# Patient Record
Sex: Male | Born: 1990 | Race: White | Hispanic: No | Marital: Single | Smoking: Never smoker
Health system: Southern US, Community
[De-identification: ages and names within clinical notes are randomized; demographics above are authoritative.]

## PROBLEM LIST (undated history)

## (undated) DIAGNOSIS — A069 Amebiasis, unspecified: Secondary | ICD-10-CM

## (undated) DIAGNOSIS — A0472 Enterocolitis due to Clostridium difficile, not specified as recurrent: Secondary | ICD-10-CM

## (undated) DIAGNOSIS — K311 Adult hypertrophic pyloric stenosis: Secondary | ICD-10-CM

## (undated) DIAGNOSIS — F329 Major depressive disorder, single episode, unspecified: Secondary | ICD-10-CM

## (undated) DIAGNOSIS — F32A Depression, unspecified: Secondary | ICD-10-CM

## (undated) DIAGNOSIS — K219 Gastro-esophageal reflux disease without esophagitis: Secondary | ICD-10-CM

## (undated) HISTORY — PX: PYLOROPLASTY: SHX418

## (undated) HISTORY — DX: Amebiasis, unspecified: A06.9

## (undated) HISTORY — DX: Depression, unspecified: F32.A

## (undated) HISTORY — DX: Major depressive disorder, single episode, unspecified: F32.9

---
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
## 2014-08-18 HISTORY — PX: OTHER SURGICAL HISTORY: SHX169

## 2014-12-27 ENCOUNTER — Encounter: Payer: Self-pay | Admitting: Gastroenterology

## 2015-01-02 ENCOUNTER — Other Ambulatory Visit: Payer: Self-pay | Admitting: Gastroenterology

## 2015-01-02 DIAGNOSIS — R1084 Generalized abdominal pain: Secondary | ICD-10-CM

## 2015-01-02 DIAGNOSIS — R634 Abnormal weight loss: Secondary | ICD-10-CM

## 2015-01-04 ENCOUNTER — Ambulatory Visit
Admission: RE | Admit: 2015-01-04 | Discharge: 2015-01-04 | Disposition: A | Discharge status: Home / Self Care | Payer: BLUE CROSS/BLUE SHIELD | Source: Ambulatory Visit | Attending: Gastroenterology | Admitting: Gastroenterology

## 2015-01-04 DIAGNOSIS — R1084 Generalized abdominal pain: Secondary | ICD-10-CM

## 2015-01-04 DIAGNOSIS — R634 Abnormal weight loss: Secondary | ICD-10-CM

## 2015-02-23 ENCOUNTER — Ambulatory Visit: Payer: Self-pay | Admitting: Gastroenterology

## 2015-03-04 ENCOUNTER — Emergency Department (HOSPITAL_COMMUNITY)
Admission: EM | Admit: 2015-03-04 | Discharge: 2015-03-04 | Disposition: A | Discharge status: Home / Self Care | Payer: BLUE CROSS/BLUE SHIELD | Attending: Emergency Medicine | Admitting: Emergency Medicine

## 2015-03-04 ENCOUNTER — Emergency Department (HOSPITAL_COMMUNITY): Payer: BLUE CROSS/BLUE SHIELD

## 2015-03-04 ENCOUNTER — Encounter (HOSPITAL_COMMUNITY): Payer: Self-pay

## 2015-03-04 DIAGNOSIS — R109 Unspecified abdominal pain: Secondary | ICD-10-CM | POA: Insufficient documentation

## 2015-03-04 DIAGNOSIS — Z8619 Personal history of other infectious and parasitic diseases: Secondary | ICD-10-CM | POA: Insufficient documentation

## 2015-03-04 DIAGNOSIS — Z720 Tobacco use: Secondary | ICD-10-CM | POA: Insufficient documentation

## 2015-03-04 DIAGNOSIS — R1110 Vomiting, unspecified: Secondary | ICD-10-CM

## 2015-03-04 HISTORY — DX: Enterocolitis due to Clostridium difficile, not specified as recurrent: A04.72

## 2015-03-04 LAB — COMPREHENSIVE METABOLIC PANEL
ALK PHOS: 69 U/L (ref 38–126)
ALT: 27 U/L (ref 17–63)
AST: 36 U/L (ref 15–41)
Albumin: 4.8 g/dL (ref 3.5–5.0)
Anion gap: 16 — ABNORMAL HIGH (ref 5–15)
BUN: 14 mg/dL (ref 6–20)
CHLORIDE: 104 mmol/L (ref 101–111)
CO2: 21 mmol/L — AB (ref 22–32)
Calcium: 9.9 mg/dL (ref 8.9–10.3)
Creatinine, Ser: 1.05 mg/dL (ref 0.61–1.24)
Glucose, Bld: 72 mg/dL (ref 65–99)
Potassium: 4.2 mmol/L (ref 3.5–5.1)
Sodium: 141 mmol/L (ref 135–145)
Total Bilirubin: 1.5 mg/dL — ABNORMAL HIGH (ref 0.3–1.2)
Total Protein: 7.7 g/dL (ref 6.5–8.1)

## 2015-03-04 LAB — CBC
HEMATOCRIT: 46.4 % (ref 39.0–52.0)
Hemoglobin: 16.5 g/dL (ref 13.0–17.0)
MCH: 30.9 pg (ref 26.0–34.0)
MCHC: 35.6 g/dL (ref 30.0–36.0)
MCV: 86.9 fL (ref 78.0–100.0)
Platelets: 169 10*3/uL (ref 150–400)
RBC: 5.34 MIL/uL (ref 4.22–5.81)
RDW: 12.9 % (ref 11.5–15.5)
WBC: 8.4 10*3/uL (ref 4.0–10.5)

## 2015-03-04 LAB — URINALYSIS, ROUTINE W REFLEX MICROSCOPIC
Bilirubin Urine: NEGATIVE
GLUCOSE, UA: NEGATIVE mg/dL
Hgb urine dipstick: NEGATIVE
LEUKOCYTES UA: NEGATIVE
NITRITE: NEGATIVE
PROTEIN: NEGATIVE mg/dL
Specific Gravity, Urine: 1.025 (ref 1.005–1.030)
UROBILINOGEN UA: 0.2 mg/dL (ref 0.0–1.0)
pH: 5 (ref 5.0–8.0)

## 2015-03-04 LAB — LIPASE, BLOOD: Lipase: 29 U/L (ref 22–51)

## 2015-03-04 MED ORDER — ONDANSETRON 4 MG PO TBDP
ORAL_TABLET | ORAL | Status: AC
  Filled 2015-03-04: qty 1

## 2015-03-04 MED ORDER — PANTOPRAZOLE SODIUM 20 MG PO TBEC: 20.0000 mg | DELAYED_RELEASE_TABLET | Freq: Two times a day (BID) | ORAL | Status: DC

## 2015-03-04 MED ORDER — SODIUM CHLORIDE 0.9 % IV SOLN
1000.0000 mL | Freq: Once | INTRAVENOUS | Status: AC
  Administered 2015-03-04: 1000 mL via INTRAVENOUS

## 2015-03-04 MED ORDER — PROMETHAZINE HCL 25 MG/ML IJ SOLN
12.5000 mg | Freq: Once | INTRAMUSCULAR | Status: DC
  Filled 2015-03-04: qty 1

## 2015-03-04 MED ORDER — ONDANSETRON HCL 4 MG PO TABS: 4.0000 mg | ORAL_TABLET | Freq: Four times a day (QID) | ORAL | Status: AC

## 2015-03-04 MED ORDER — ONDANSETRON 4 MG PO TBDP
4.0000 mg | ORAL_TABLET | Freq: Once | ORAL | Status: AC | PRN
  Administered 2015-03-04: 4 mg via ORAL

## 2015-03-04 MED ORDER — SODIUM CHLORIDE 0.9 % IV BOLUS (SEPSIS)
1000.0000 mL | Freq: Once | INTRAVENOUS | Status: AC
  Administered 2015-03-04: 1000 mL via INTRAVENOUS

## 2015-03-04 MED ORDER — PANTOPRAZOLE SODIUM 40 MG IV SOLR
40.0000 mg | Freq: Once | INTRAVENOUS | Status: AC
  Administered 2015-03-04: 40 mg via INTRAVENOUS
  Filled 2015-03-04: qty 40

## 2015-03-04 MED ORDER — PROMETHAZINE HCL 25 MG/ML IJ SOLN
12.5000 mg | Freq: Once | INTRAMUSCULAR | Status: AC
  Administered 2015-03-04: 12.5 mg via INTRAVENOUS
  Filled 2015-03-04: qty 1

## 2015-03-04 MED ORDER — VANCOMYCIN HCL IN DEXTROSE 1-5 GM/200ML-% IV SOLN
1000.0000 mg | Freq: Once | INTRAVENOUS | Status: AC
  Administered 2015-03-04: 1000 mg via INTRAVENOUS
  Filled 2015-03-04: qty 200

## 2015-03-04 NOTE — ED Notes (Signed)
Pt ambulated to the bathroom with ease 

## 2015-03-04 NOTE — ED Notes (Signed)
Pt drank beers last night, did not think he drunk more than usual.  Onset this morning vomiting x 30, small amounts, abd discomfort.  Pt is being treated for Cdiff- has been on antibiotics x 1 week.  Pyloric stenosis surgery 6 weeks ago.

## 2015-03-04 NOTE — Discharge Instructions (Signed)

## 2015-03-04 NOTE — ED Provider Notes (Signed)
CSN: [id number]     Arrival date & time 03/04/15  1227 History   First MD Initiated Contact with Patient 03/04/15 1343     Chief Complaint  Patient presents with  . Emesis      HPI Pt drank beers last night, did not think he drunk more than usual. Onset this morning vomiting x 30, small amounts, abd discomfort. Pt is being treated for Cdiff- has been on antibiotics x 1 week. Pyloric stenosis surgery 6 weeks ago.  Past Medical History  Diagnosis Date  . C. difficile diarrhea    Past Surgical History  Procedure Laterality Date  . Pyloric stenosis sx   2016   History reviewed. No pertinent family history. History  Substance Use Topics  . Smoking status: Current Every Day Smoker  . Smokeless tobacco: Not on file  . Alcohol Use: Yes    Review of Systems  All other systems reviewed and are negative  Allergies  Review of patient's allergies indicates no known allergies.  Home Medications   Prior to Admission medications   Not on File   BP 127/78 mmHg  Pulse 57  Temp(Src) 97.4 F (36.3 C) (Axillary)  Resp 18  Ht 5\' 9"  (1.753 m)  Wt 130 lb (58.968 kg)  BMI 19.19 kg/m2  SpO2 97% Physical Exam Physical Exam  Nursing note and vitals reviewed. Constitutional: He is oriented to person, place, and time. He appears well-developed and well-nourished. No distress.  HENT:  Head: Normocephalic and atraumatic.  Eyes: Pupils are equal, round, and reactive to light.  Neck: Normal range of motion.  Cardiovascular: Normal rate and intact distal pulses.   Pulmonary/Chest: No respiratory distress.  Abdominal: Normal appearance. He exhibits no distension.  Musculoskeletal: Normal range of motion.  Neurological: He is alert and oriented to person, place, and time. No cranial nerve deficit.  Skin: Skin is warm and dry. No rash noted.  Psychiatric: He has a normal mood and affect. His behavior is normal.   ED Course  Procedures (including critical care time) Medications   ondansetron (ZOFRAN-ODT) 4 MG disintegrating tablet (not administered)  pantoprazole (PROTONIX) injection 40 mg (not administered)  vancomycin (VANCOCIN) IVPB 1000 mg/200 mL premix (not administered)  0.9 %  sodium chloride infusion (not administered)  promethazine (PHENERGAN) injection 12.5 mg (not administered)  ondansetron (ZOFRAN-ODT) disintegrating tablet 4 mg (4 mg Oral Given 03/04/15 1244)  sodium chloride 0.9 % bolus 1,000 mL (0 mLs Intravenous Stopped 03/04/15 1507)  promethazine (PHENERGAN) injection 12.5 mg (12.5 mg Intravenous Given 03/04/15 1506)    Labs Review Labs Reviewed  COMPREHENSIVE METABOLIC PANEL - Abnormal; Notable for the following:    CO2 21 (*)    Total Bilirubin 1.5 (*)    Anion gap 16 (*)    All other components within normal limits  URINALYSIS, ROUTINE W REFLEX MICROSCOPIC (NOT AT [facility]) - Abnormal; Notable for the following:    Ketones, ur >80 (*)    All other components within normal limits  LIPASE, BLOOD  CBC    Imaging Review Dg Abd Acute W/chest  03/04/2015   CLINICAL DATA:  Nausea, vomiting, diarrhea  EXAM: DG ABDOMEN ACUTE W/ 1V CHEST  COMPARISON:  11/ 18/0 6  FINDINGS: Cardiomediastinal silhouette is unremarkable. No acute infiltrate or pleural effusion. No pulmonary edema. There is normal small bowel gas pattern. No pathologic calcifications. Bony structures are unremarkable.  IMPRESSION: Negative abdominal radiographs.  No acute cardiopulmonary disease.   Electronically Signed   By: Lanette HampshireLiviu  Pop M.D.  On: 03/04/2015 15:03    I discussed the case with gastroenterology who recommended symptomatic treatment.  MDM   Final diagnoses:  Vomiting        Kyle Nay, MD 03/05/15 [phone]

## 2015-03-04 NOTE — ED Notes (Signed)
Pt tolerated PO intake

## 2015-03-04 NOTE — ED Notes (Signed)
PT confirmed that he had all belongings.

## 2015-03-04 NOTE — ED Provider Notes (Signed)
  Physical Exam  BP 122/61 mmHg  Pulse 59  Temp(Src) 97.4 F (36.3 C) (Axillary)  Resp 18  Ht 5\' 9"  (1.753 m)  Wt 130 lb (58.968 kg)  BMI 19.19 kg/m2  SpO2 97%  Physical Exam  ED Course  Procedures  MDM Care assumed at sign out. Patient hx of C diff on PO vanc. Here with vomiting. Labs unremarkable. Sign out pending PO trial. Able to tolerate PO fluids. Will dc home with zofran, protonix as per Dr. Radford PaxBeaton.   Richardean Canalavid H Yao, MD 03/04/15 [phone]

## 2015-03-14 ENCOUNTER — Telehealth: Payer: Self-pay | Admitting: Gastroenterology

## 2015-03-14 NOTE — Telephone Encounter (Signed)
Spoke to the pt's mother she states the pt was originally scheduled to see Dr. Christella Hartigan, but they decided to go with Dr. Kinnie Scales, because they were seen faster. They would now like to get another appointment with Dr. Christella Hartigan. She will obtain records from Dr. Kinnie Scales to be reviewed. She says that her sons personality and Dr. Jennye Boroughs didn't match. He saw Dr. Kinnie Scales last week.

## 2015-03-22 ENCOUNTER — Telehealth: Payer: Self-pay

## 2015-03-22 NOTE — Telephone Encounter (Signed)
Pt has been scheduled with Dr Christella Hartigan 04/06/15 10:15 am, pt father was given the appt date and time.

## 2015-03-22 NOTE — Telephone Encounter (Signed)
Yes, just not next week. Anytime the week after would be great.  Thanks

## 2015-03-22 NOTE — Telephone Encounter (Signed)
Dr Christella Hartigan we received the records from the previous GI (this is the pt Kyle Zuniga talked to you about, you see his brother).  Do you want me to go ahead and double book him with you?

## 2015-04-06 ENCOUNTER — Ambulatory Visit (INDEPENDENT_AMBULATORY_CARE_PROVIDER_SITE_OTHER): Payer: BLUE CROSS/BLUE SHIELD | Admitting: Gastroenterology

## 2015-04-06 ENCOUNTER — Encounter: Payer: Self-pay | Admitting: Gastroenterology

## 2015-04-06 VITALS — BP 92/58 | HR 64 | Ht 68.75 in | Wt 121.5 lb

## 2015-04-06 DIAGNOSIS — R1111 Vomiting without nausea: Secondary | ICD-10-CM

## 2015-04-06 NOTE — Progress Notes (Addendum)
HPI: This is a   very pleasant 24 year old man  whom I am meeting for the first time. I diagnosed his brother with small bowel lymphoma about a year ago. He is here with his mother and father today    Chief complaint is bloating, dyspepsia, weight loss, diarrhea.  Has had issues with GI tract for a long time.  Bloating with small meals, early satiety.  More nausea lately. He thinks he improved after pyloric dilation. Was on liquid diet for a week between the EGDs.  Tries to eat twice daily. Has been losing weight.  Was drinking 6-12 beers about twice per week.  Drinks 5-6 cokes daily.  Was put on antibiotics, vancomycin for c. Diff.  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  EGD, Dr. Kinnie Scales, 01/04/2015. Done for epigastric pain and heartburn. 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 He recommended avoiding NSAIDs and he repeated the upper endoscopy one week later. Repeat EGD 01/11/2015 showed that the bezoar had completely resolved. He pain more attention to the pyloric stenosis and balloon dilated that up to 15 mm. He recommended proton pump inhibitor Reglan and low residue diet. Abdominal ultrasound May 2016 done for abdominal pain, postprandial, weight loss. Gallbladder was contracted otherwise the examination was normal. Bloodwork May 2016; celiac panel was negative, C-reactive protein was normal, pancreatic enzymes were normal. TSH was normal, iron testing was normal. GI pathogen panel may 2016; was positive for C. 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  Review of systems: Pertinent positive and negative review of systems were noted in the above HPI section. Complete review of systems was performed and was otherwise normal.   Past Medical History  Diagnosis Date  . C. difficile diarrhea   . Depression   . Entamebiasis coli      Past Surgical History  Procedure Laterality Date  . Pyloric stenosis sx   2016    Dr. Kinnie Scales    Current Outpatient Prescriptions  Medication Sig Dispense Refill  . ondansetron (ZOFRAN) 4 MG tablet Take 1 tablet (4 mg total) by mouth every 6 (six) hours. 12 tablet 0  . sertraline (ZOLOFT) 50 MG tablet Take 50 mg by mouth daily.  2   No current facility-administered medications for this visit.    Allergies as of 04/06/2015  . (No Known Allergies)    Family History  Problem Relation Age of Onset  . Pancreatic cancer Paternal Grandmother   . Melanoma Father   . Non-Hodgkin's lymphoma Brother     langerhans histiocytosis    Social History   Social History  . Marital Status: Married    Spouse Name: N/A  . Number of Children: 0  . Years of Education: N/A   Occupational History  . student    Social History Main Topics  . Smoking status: Never Smoker   . Smokeless tobacco: Never Used  . Alcohol Use: 4.2 oz/week    7 Standard drinks or equivalent per week  . Drug Use: Yes    Special: Marijuana  . Sexual Activity: Not on file   Other Topics Concern  . Not on file   Social History Narrative     Physical Exam: BP 92/58 mmHg  Pulse 64  Ht 5' 8.75" (1.746 m)  Wt 121 lb 8 oz (55.112 kg)  BMI 18.08 kg/m2 Constitutional: generally well-appearing Psychiatric: alert and oriented  x3 Eyes: extraocular movements intact Mouth: oral pharynx moist, no lesions Neck: supple no lymphadenopathy Cardiovascular: heart regular rate and rhythm Lungs: clear to auscultation bilaterally Abdomen: soft, nontender, nondistended, no obvious ascites, no peritoneal signs, normal bowel sounds Extremities: no lower extremity edema bilaterally Skin: no lesions on visible extremities   Assessment and plan: 24 y.o. 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 He was told that his pylorus was nearly a pinhole by Dr. Kinnie Scales. His mother tells me that congenital pyloric stenosis actually runs in her family. Keyandre as a child did have vomiting intermittently none sounds particularly projectile or overwhelming. I do wonder if he has some type of significant pyloric stenosis or etiology unclear. He is going to have an upper GI barium test first. Following that I may recommend repeat EGD His diarrhea is chronic. He was found to possibly have C. difficile and Entamoeba histolytica by Dr. Kinnie Scales review was treated for both. His diarrhea really hasn't proved. I'm struck by the fact that he drinks 5-6 cokes per day. This could certainly contribute to diarrhea. He is going to cut back on that as best that he can and start a single Imodium once daily. I'm not inclined to perform colonoscopy just yet, prefer to workup his upper GI tract issues first but it may come that he needs colonoscopy as well.  He will also get a CT scan abdomen and pelvis today check for extrinsic compression causing pyloric stenosis.   Rob Bunting, MD [facility] 04/06/2015, 10:31 AM  Cc: No ref. provider found

## 2015-04-06 NOTE — Patient Instructions (Addendum)
Taper your caffeine to one one caffinated beverage daily. Start one imodium every morning. UGI barium test.  ??pyloric stenosis?? You may need EGD, colonoscopy. We will order a CT scan.   You have been scheduled for an Upper GI Series at [facility]. Your appointment is on Wednesday, 04/11/15 at 11:00 am. Please arrive 15 minutes prior to your test for registration. Make sure not to eat or drink anything after midnight on the night before your test. If you need to reschedule, please call radiology at [phone]. ________________________________________________________________ An upper GI series uses x rays to help diagnose problems of the upper GI tract, which includes the esophagus, stomach, and duodenum. The duodenum is the first part of the small intestine. An upper GI series is conducted by a radiology technologist or a radiologist-a doctor who specializes in x-ray imaging-at a hospital or outpatient center. While sitting or standing in front of an x-ray machine, the patient drinks barium liquid, which is often white and has a chalky consistency and taste. The barium liquid coats the lining of the upper GI tract and makes signs of disease show up more clearly on x rays. X-ray video, called fluoroscopy, is used to view the barium liquid moving through the esophagus, stomach, and duodenum. Additional x rays and fluoroscopy are performed while the patient lies on an x-ray table. To fully coat the upper GI tract with barium liquid, the technologist or radiologist may press on the abdomen or ask the patient to change position. Patients hold still in various positions, allowing the technologist or radiologist to take x rays of the upper GI tract at different angles. If a technologist conducts the upper GI series, a radiologist will later examine the images to look for problems.  This test typically takes about 1 hour to  complete. __________________________________________________________________  Kyle Zuniga have been scheduled for a CT scan of the abdomen and pelvis at [facility] ([address]---this is in the same building as Press photographer).   You are scheduled on Thursday 04/12/15 at 2:30 pm. You should arrive 15 minutes prior to your appointment time for registration. Please follow the written instructions below on the day of your exam:  WARNING: IF YOU ARE ALLERGIC TO IODINE/X-RAY DYE, PLEASE NOTIFY RADIOLOGY IMMEDIATELY AT [phone]! YOU WILL BE GIVEN A 13 HOUR PREMEDICATION PREP.  1) Do not eat or drink anything after 10:30 am (4 hours prior to your test) 2) You have been given 2 bottles of oral contrast to drink. The solution may taste better if refrigerated, but do NOT add ice or any other liquid to this solution. Shake well before drinking.    Drink 1 bottle of contrast @ 12:30 pm (2 hours prior to your exam)  Drink 1 bottle of contrast @ 1:30 pm (1 hour prior to your exam)  You may take any medications as prescribed with a small amount of water except for the following: Metformin, Glucophage, Glucovance, Avandamet, Riomet, Fortamet, Actoplus Met, Janumet, Glumetza or Metaglip. The above medications must be held the day of the exam AND 48 hours after the exam.  The purpose of you drinking the oral contrast is to aid in the visualization of your intestinal tract. The contrast solution may cause some diarrhea. Before your exam is started, you will be given a small amount of fluid to drink. Depending on your individual set of symptoms, you may also receive an intravenous injection of x-ray contrast/dye. Plan on being at [facility] for 30 minutes or  long, depending on the type of exam you are having performed.  This test typically takes 30-45 minutes to complete.  If you have any questions regarding your exam or if you need to reschedule, you may call the CT department at  [phone] between the hours of 8:00 am and 5:00 pm, Monday-Friday.  ________________________________________________________________________

## 2015-04-09 ENCOUNTER — Telehealth: Payer: Self-pay | Admitting: Gastroenterology

## 2015-04-09 ENCOUNTER — Ambulatory Visit (HOSPITAL_COMMUNITY): Payer: BLUE CROSS/BLUE SHIELD

## 2015-04-09 NOTE — Telephone Encounter (Signed)
I spoke with the insurance company.  [employer] number is [id number]

## 2015-04-11 ENCOUNTER — Ambulatory Visit (HOSPITAL_COMMUNITY)
Admission: RE | Admit: 2015-04-11 | Discharge: 2015-04-11 | Disposition: A | Discharge status: Home / Self Care | Payer: BLUE CROSS/BLUE SHIELD | Source: Ambulatory Visit | Attending: Gastroenterology | Admitting: Gastroenterology

## 2015-04-11 DIAGNOSIS — R1111 Vomiting without nausea: Secondary | ICD-10-CM | POA: Diagnosis present

## 2015-04-11 DIAGNOSIS — R634 Abnormal weight loss: Secondary | ICD-10-CM | POA: Insufficient documentation

## 2015-04-12 ENCOUNTER — Ambulatory Visit
Admission: RE | Admit: 2015-04-12 | Discharge: 2015-04-12 | Disposition: A | Discharge status: Home / Self Care | Payer: BLUE CROSS/BLUE SHIELD | Source: Ambulatory Visit | Attending: Gastroenterology | Admitting: Gastroenterology

## 2015-04-12 DIAGNOSIS — R1111 Vomiting without nausea: Secondary | ICD-10-CM

## 2015-04-12 MED ORDER — IOHEXOL 300 MG/ML  SOLN: 100.0000 mL | Freq: Once | INTRAMUSCULAR | Status: DC | PRN

## 2015-04-17 ENCOUNTER — Other Ambulatory Visit: Payer: Self-pay

## 2015-04-17 DIAGNOSIS — M79602 Pain in left arm: Secondary | ICD-10-CM

## 2015-04-18 ENCOUNTER — Ambulatory Visit (INDEPENDENT_AMBULATORY_CARE_PROVIDER_SITE_OTHER)
Admission: RE | Admit: 2015-04-18 | Discharge: 2015-04-18 | Disposition: A | Discharge status: Home / Self Care | Payer: BLUE CROSS/BLUE SHIELD | Source: Ambulatory Visit | Attending: Gastroenterology | Admitting: Gastroenterology

## 2015-04-18 DIAGNOSIS — R1111 Vomiting without nausea: Secondary | ICD-10-CM | POA: Diagnosis not present

## 2015-04-18 MED ORDER — IOHEXOL 300 MG/ML  SOLN
100.0000 mL | Freq: Once | INTRAMUSCULAR | Status: AC | PRN
  Administered 2015-04-18: 100 mL via INTRAVENOUS

## 2015-04-19 ENCOUNTER — Other Ambulatory Visit: Payer: Self-pay

## 2015-04-19 ENCOUNTER — Encounter (HOSPITAL_COMMUNITY): Payer: Self-pay | Admitting: *Deleted

## 2015-04-19 ENCOUNTER — Telehealth: Payer: Self-pay | Admitting: Gastroenterology

## 2015-04-19 DIAGNOSIS — K311 Adult hypertrophic pyloric stenosis: Secondary | ICD-10-CM

## 2015-04-19 NOTE — Telephone Encounter (Signed)
Pt has been notified see alternate note  

## 2015-04-25 NOTE — Anesthesia Preprocedure Evaluation (Signed)
Anesthesia Evaluation  Patient identified by MRN, date of birth, ID band Patient awake    Reviewed: Allergy & Precautions, H&P , NPO status , Patient's Chart, lab work & pertinent test results  Airway Mallampati: II  TM Distance: >3 FB Neck ROM: full    Dental no notable dental hx. (+) Dental Advisory Given   Pulmonary neg pulmonary ROS,    Pulmonary exam normal breath sounds clear to auscultation       Cardiovascular Exercise Tolerance: Good negative cardio ROS Normal cardiovascular exam Rhythm:regular Rate:Normal     Neuro/Psych negative neurological ROS  negative psych ROS   GI/Hepatic negative GI ROS, Neg liver ROS, C difficile diarrhea   Endo/Other  negative endocrine ROS  Renal/GU negative Renal ROS  negative genitourinary   Musculoskeletal   Abdominal   Peds  Hematology negative hematology ROS (+)   Anesthesia Other Findings   Reproductive/Obstetrics negative OB ROS                             Anesthesia Physical Anesthesia Plan  ASA: II  Anesthesia Plan: MAC   Post-op Pain Management:    Induction:   Airway Management Planned:   Additional Equipment:   Intra-op Plan:   Post-operative Plan:   Informed Consent: I have reviewed the patients History and Physical, chart, labs and discussed the procedure including the risks, benefits and alternatives for the proposed anesthesia with the patient or authorized representative who has indicated his/her understanding and acceptance.   Dental Advisory Given  Plan Discussed with: CRNA and Surgeon  Anesthesia Plan Comments:         Anesthesia Quick Evaluation

## 2015-04-26 ENCOUNTER — Ambulatory Visit (HOSPITAL_COMMUNITY)
Admission: RE | Admit: 2015-04-26 | Discharge: 2015-04-26 | Disposition: A | Discharge status: Home / Self Care | Payer: BLUE CROSS/BLUE SHIELD | Source: Ambulatory Visit | Attending: Gastroenterology | Admitting: Gastroenterology

## 2015-04-26 ENCOUNTER — Encounter (HOSPITAL_COMMUNITY)
Admission: RE | Disposition: A | Discharge status: Home / Self Care | Payer: Self-pay | Source: Ambulatory Visit | Attending: Gastroenterology

## 2015-04-26 ENCOUNTER — Ambulatory Visit (HOSPITAL_COMMUNITY): Payer: BLUE CROSS/BLUE SHIELD | Admitting: Anesthesiology

## 2015-04-26 ENCOUNTER — Encounter (HOSPITAL_COMMUNITY): Payer: Self-pay | Admitting: *Deleted

## 2015-04-26 DIAGNOSIS — R140 Abdominal distension (gaseous): Secondary | ICD-10-CM | POA: Insufficient documentation

## 2015-04-26 DIAGNOSIS — K2950 Unspecified chronic gastritis without bleeding: Secondary | ICD-10-CM | POA: Insufficient documentation

## 2015-04-26 DIAGNOSIS — Z79899 Other long term (current) drug therapy: Secondary | ICD-10-CM | POA: Diagnosis not present

## 2015-04-26 DIAGNOSIS — K311 Adult hypertrophic pyloric stenosis: Secondary | ICD-10-CM | POA: Diagnosis not present

## 2015-04-26 DIAGNOSIS — R1013 Epigastric pain: Secondary | ICD-10-CM | POA: Diagnosis not present

## 2015-04-26 DIAGNOSIS — Z681 Body mass index (BMI) 19 or less, adult: Secondary | ICD-10-CM | POA: Diagnosis not present

## 2015-04-26 DIAGNOSIS — R197 Diarrhea, unspecified: Secondary | ICD-10-CM | POA: Diagnosis not present

## 2015-04-26 DIAGNOSIS — K2970 Gastritis, unspecified, without bleeding: Secondary | ICD-10-CM

## 2015-04-26 DIAGNOSIS — R634 Abnormal weight loss: Secondary | ICD-10-CM | POA: Insufficient documentation

## 2015-04-26 HISTORY — DX: Gastro-esophageal reflux disease without esophagitis: K21.9

## 2015-04-26 HISTORY — PX: ESOPHAGOGASTRODUODENOSCOPY (EGD) WITH PROPOFOL: SHX5813

## 2015-04-26 SURGERY — ESOPHAGOGASTRODUODENOSCOPY (EGD) WITH PROPOFOL
Anesthesia: Monitor Anesthesia Care

## 2015-04-26 MED ORDER — SODIUM CHLORIDE 0.9 % IV SOLN: INTRAVENOUS | Status: DC

## 2015-04-26 MED ORDER — PROPOFOL 10 MG/ML IV BOLUS
INTRAVENOUS | Status: AC
  Filled 2015-04-26: qty 20

## 2015-04-26 MED ORDER — PROPOFOL INFUSION 10 MG/ML OPTIME
INTRAVENOUS | Status: DC | PRN
  Administered 2015-04-26: 150 ug/kg/min via INTRAVENOUS

## 2015-04-26 MED ORDER — LACTATED RINGERS IV SOLN
INTRAVENOUS | Status: DC
  Administered 2015-04-26: 1000 mL via INTRAVENOUS

## 2015-04-26 MED ORDER — PROPOFOL 10 MG/ML IV BOLUS
INTRAVENOUS | Status: DC | PRN
  Administered 2015-04-26 (×2): 30 mg via INTRAVENOUS
  Administered 2015-04-26 (×2): 20 mg via INTRAVENOUS

## 2015-04-26 SURGICAL SUPPLY — 14 items

## 2015-04-26 NOTE — Anesthesia Postprocedure Evaluation (Signed)
  Anesthesia Post-op Note  Patient: Kyle Zuniga  Procedure(s) Performed: Procedure(s) (LRB): ESOPHAGOGASTRODUODENOSCOPY (EGD) WITH PROPOFOL (N/A)  Patient Location: PACU  Anesthesia Type: MAC  Level of Consciousness: awake and alert   Airway and Oxygen Therapy: Patient Spontanous Breathing  Post-op Pain: mild  Post-op Assessment: Post-op Vital signs reviewed, Patient's Cardiovascular Status Stable, Respiratory Function Stable, Patent Airway and No signs of Nausea or vomiting  Last Vitals:  Filed Vitals:   04/26/15 0825  BP:   Pulse: 45  Temp:   Resp: 15    Post-op Vital Signs: stable   Complications: No apparent anesthesia complications

## 2015-04-26 NOTE — Interval H&P Note (Signed)
History and Physical Interval Note:  04/26/2015 7:21 AM  Kyle Zuniga  has presented today for surgery, with the diagnosis of pylorus stricture  The various methods of treatment have been discussed with the patient and family. After consideration of risks, benefits and other options for treatment, the patient has consented to  Procedure(s): ESOPHAGOGASTRODUODENOSCOPY (EGD) WITH PROPOFOL (N/A) as a surgical intervention .  The patient's history has been reviewed, patient examined, no change in status, stable for surgery.  I have reviewed the patient's chart and labs.  Questions were answered to the patient's satisfaction.     Rachael Fee

## 2015-04-26 NOTE — Discharge Instructions (Signed)

## 2015-04-26 NOTE — H&P (View-Only) (Signed)
HPI: This is a   very pleasant 24 year old man  whom I am meeting for the first time. I diagnosed his brother with small bowel lymphoma about a year ago. He is here with his mother and father today    Chief complaint is bloating, dyspepsia, weight loss, diarrhea.  Has had issues with GI tract for a long time.  Bloating with small meals, early satiety.  More nausea lately. He thinks he improved after pyloric dilation. Was on liquid diet for a week between the EGDs.  Tries to eat twice daily. Has been losing weight.  Was drinking 6-12 beers about twice per week.  Drinks 5-6 cokes daily.  Was put on antibiotics, vancomycin for c. Diff.  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  EGD, Dr. Kinnie Scales, 01/04/2015. Done for epigastric pain and heartburn. 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 He recommended avoiding NSAIDs and he repeated the upper endoscopy one week later. Repeat EGD 01/11/2015 showed that the bezoar had completely resolved. He pain more attention to the pyloric stenosis and balloon dilated that up to 15 mm. He recommended proton pump inhibitor Reglan and low residue diet. Abdominal ultrasound May 2016 done for abdominal pain, postprandial, weight loss. Gallbladder was contracted otherwise the examination was normal. Bloodwork May 2016; celiac panel was negative, C-reactive protein was normal, pancreatic enzymes were normal. TSH was normal, iron testing was normal. GI pathogen panel may 2016; was positive for C. 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  Review of systems: Pertinent positive and negative review of systems were noted in the above HPI section. Complete review of systems was performed and was otherwise normal.   Past Medical History  Diagnosis Date  . C. difficile diarrhea   . Depression   . Entamebiasis coli      Past Surgical History  Procedure Laterality Date  . Pyloric stenosis sx   2016    Dr. Kinnie Scales    Current Outpatient Prescriptions  Medication Sig Dispense Refill  . ondansetron (ZOFRAN) 4 MG tablet Take 1 tablet (4 mg total) by mouth every 6 (six) hours. 12 tablet 0  . sertraline (ZOLOFT) 50 MG tablet Take 50 mg by mouth daily.  2   No current facility-administered medications for this visit.    Allergies as of 04/06/2015  . (No Known Allergies)    Family History  Problem Relation Age of Onset  . Pancreatic cancer Paternal Grandmother   . Melanoma Father   . Non-Hodgkin's lymphoma Brother     langerhans histiocytosis    Social History   Social History  . Marital Status: Married    Spouse Name: N/A  . Number of Children: 0  . Years of Education: N/A   Occupational History  . student    Social History Main Topics  . Smoking status: Never Smoker   . Smokeless tobacco: Never Used  . Alcohol Use: 4.2 oz/week    7 Standard drinks or equivalent per week  . Drug Use: Yes    Special: Marijuana  . Sexual Activity: Not on file   Other Topics Concern  . Not on file   Social History Narrative     Physical Exam: BP 92/58 mmHg  Pulse 64  Ht 5' 8.75" (1.746 m)  Wt 121 lb 8 oz (55.112 kg)  BMI 18.08 kg/m2 Constitutional: generally well-appearing Psychiatric: alert and oriented  x3 Eyes: extraocular movements intact Mouth: oral pharynx moist, no lesions Neck: supple no lymphadenopathy Cardiovascular: heart regular rate and rhythm Lungs: clear to auscultation bilaterally Abdomen: soft, nontender, nondistended, no obvious ascites, no peritoneal signs, normal bowel sounds Extremities: no lower extremity edema bilaterally Skin: no lesions on visible extremities   Assessment and plan: 24 y.o. 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 He was told that his pylorus was nearly a pinhole by Dr. Kinnie Scales. His mother tells me that congenital pyloric stenosis actually runs in her family. Keyandre as a child did have vomiting intermittently none sounds particularly projectile or overwhelming. I do wonder if he has some type of significant pyloric stenosis or etiology unclear. He is going to have an upper GI barium test first. Following that I may recommend repeat EGD His diarrhea is chronic. He was found to possibly have C. difficile and Entamoeba histolytica by Dr. Kinnie Scales review was treated for both. His diarrhea really hasn't proved. I'm struck by the fact that he drinks 5-6 cokes per day. This could certainly contribute to diarrhea. He is going to cut back on that as best that he can and start a single Imodium once daily. I'm not inclined to perform colonoscopy just yet, prefer to workup his upper GI tract issues first but it may come that he needs colonoscopy as well.  He will also get a CT scan abdomen and pelvis today check for extrinsic compression causing pyloric stenosis.   Rob Bunting, MD [facility] 04/06/2015, 10:31 AM  Cc: No ref. provider found

## 2015-04-26 NOTE — Op Note (Signed)
[facility] [address] [place], [address]   ENDOSCOPY PROCEDURE REPORT  PATIENT: Kyle, Zuniga  MR#: [id number] BIRTHDATE: 06/05/1991 , 23  yrs. old GENDER: male ENDOSCOPIST: Rachael Fee, MD PROCEDURE DATE:  04/26/2015 PROCEDURE:  EGD w/ biopsy and EGD w/ balloon dilation ASA CLASS:     Class II INDICATIONS:  abdominal pain, vomiting, weight loss: EGD, Dr. Kinnie Scales, 01/04/2015.  Done for epigastric pain and heartburn.  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  He recommended avoiding NSAIDs and he repeated the upper endoscopy one week later.  Repeat EGD 01/11/2015 showed that the bezoar had completely resolved.  He pain more attention to the pyloric stenosis and balloon dilated that up to 15 mm.  He recommended proton pump inhibitor Reglan and low residue diet.Marland Kitchen MEDICATIONS: Monitored anesthesia care TOPICAL ANESTHETIC: none  DESCRIPTION OF PROCEDURE: After the risks benefits and alternatives of the procedure were thoroughly explained, informed consent was obtained.  The Pentax Gastroscope [id number] endoscope was introduced through the mouth and advanced to the second portion of the duodenum , Without limitations.  The instrument was slowly withdrawn as the mucosa was fully examined.  There was a moderate amount of retained gastric contents (mixed liquid/solid).  The pylorus appeared stenotic with a 2-2mm lumen. I pushed the adult gastroscope into the pylorus with moderate resistance only and was able to enter the duodenum.  After that, the pyloric opening appeared more normal however I elected to dilate the pylorus with TTS balloon held inflated to 20mm for 1 minute.  There was mild, distal gastritis that was biopsied and sent to pathology.  The examination was otherwise normal. Retroflexed views revealed no abnormalities.     The scope was then withdrawn from the patient and the  procedure completed. COMPLICATIONS: There were no immediate complications. ENDOSCOPIC IMPRESSION: There was a moderate amount of retained gastric contents (mixed liquid/solid).  The pylorus appeared stenotic with a 2-77mm lumen. I pushed the adult gastroscope into the pylorus with moderate resistance only and was able to enter the duodenum.  After that, the pyloric opening appeared more normal however I elected to dilate the pylorus with TTS balloon held inflated to 20mm for 1 minute.  There was mild, distal gastritis that was biopsied and sent to pathology.  The examination was otherwise normal  RECOMMENDATIONS: Please try to avoid caffinated beverages and alcoholic beverages. Please call my office in 3-4 weeks to report on your response to this dilation and avoiding those drinks.  If the biopsies show H. pylori, you will be started on appropriate antibiotics.  eSigned:  Rachael Fee, MD 04/26/2015 8:05 AM     PATIENT NAME:  Kyle, Zuniga MR#: [id number]

## 2015-04-26 NOTE — Transfer of Care (Signed)
Immediate Anesthesia Transfer of Care Note  Patient: Kyle Zuniga  Procedure(s) Performed: Procedure(s): ESOPHAGOGASTRODUODENOSCOPY (EGD) WITH PROPOFOL (N/A)  Patient Location: PACU and Endoscopy Unit  Anesthesia Type:MAC  Level of Consciousness: awake, alert  and patient cooperative  Airway & Oxygen Therapy: Patient Spontanous Breathing and Patient connected to nasal cannula oxygen  Post-op Assessment: Report given to RN and Post -op Vital signs reviewed and stable  Post vital signs: Reviewed and stable  Last Vitals:  Filed Vitals:   04/26/15 0654  BP: 118/57  Temp: 36.7 C  Resp: 14    Complications: No apparent anesthesia complications

## 2015-05-01 ENCOUNTER — Encounter (HOSPITAL_COMMUNITY): Payer: Self-pay | Admitting: Gastroenterology

## 2015-05-14 ENCOUNTER — Telehealth: Payer: Self-pay | Admitting: Gastroenterology

## 2015-05-14 NOTE — Telephone Encounter (Signed)
Can you please call the patient and speak to him directly about how he is doing? Vomiting, pain, loose stools?  Ask about caffeine and etoh intake as well.  Thanks.

## 2015-05-14 NOTE — Telephone Encounter (Signed)
Patient's mother called to report that he was doing well until Saturday.  He is having intermittent pain lasting about 15 minutes at a time once or twice a day.  He is having diarrhea, mom reports that he is having 3-4 episodes a day despite daily imodium.  Patient was not with Mom when I spoke to her.  She wanted you to be aware of his continued symptoms

## 2015-05-14 NOTE — Telephone Encounter (Signed)
I spoke with the patient's father and he gave me a number to call [phone] Left message for patient to call back

## 2015-05-15 NOTE — Telephone Encounter (Signed)
No vomiting, some pain after eating, about the same loose stools.  Has been avoiding ETOH and caffeine.

## 2015-05-16 NOTE — Telephone Encounter (Signed)
Would like to refer him to Dr. Alycia Rossetti at [facility] for ?pyloric stenosis.

## 2015-05-17 ENCOUNTER — Telehealth: Payer: Self-pay | Admitting: Gastroenterology

## 2015-05-17 NOTE — Telephone Encounter (Signed)
Dr Christella Hartigan the pt's mother wants to know what you think about botox, also Dr Alycia Rossetti has no openings until next year.  I did request they put the records on his desk for review to work the pt in.  They will call after the records are reviewed.

## 2015-05-17 NOTE — Telephone Encounter (Signed)
See alternate note  

## 2015-05-17 NOTE — Telephone Encounter (Signed)
Father calling who relates his son has had worsening abdominal pain over past few days. Same symptoms as before his EGD w/ pyloric dilation a few weeks ago. He called office but has not heard back. Advised a clear liquid diet for now and I will sent message to Dr Christella Hartigan.

## 2015-05-17 NOTE — Telephone Encounter (Signed)
Pt referral has been made, Dr Alycia Rossetti has no appt available until next year, the information will be put on Dr Coral Else desk for review and the pt called directly.  I have notified the pt regarding the status

## 2015-05-17 NOTE — Telephone Encounter (Signed)
Kyle Zuniga, he needs referral to Dr. Alycia Rossetti at [facility] for pyloric stenosis ?

## 2015-05-17 NOTE — Telephone Encounter (Signed)
error 

## 2015-05-18 ENCOUNTER — Telehealth: Payer: Self-pay | Admitting: Gastroenterology

## 2015-05-18 NOTE — Telephone Encounter (Signed)
I LMOVM for him to call back to discuss his symptoms.  His mother has asked about pyloric injection of botox which there is very little literature to support.  Not even clear of his underlying diagnosis however his pyrloric sphincter is tight, he's had beazor [facility] 2016 EGD) and I also saw retained food in stomach with tight pylorus.

## 2015-05-21 ENCOUNTER — Ambulatory Visit: Payer: Self-pay | Admitting: Gastroenterology

## 2015-05-25 ENCOUNTER — Telehealth: Payer: Self-pay | Admitting: Gastroenterology

## 2015-05-29 NOTE — Telephone Encounter (Signed)
Dr Christella Hartigan where else would you like to refer to?

## 2015-05-30 NOTE — Telephone Encounter (Signed)
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

## 2015-05-31 NOTE — Telephone Encounter (Signed)
I spoke with [facility] and they are going to review the records and call pt is accepted.  I have faxed records to [phone]  Pt was actually seen by [facility] and needs no further referrals.

## 2015-05-31 NOTE — Telephone Encounter (Signed)
Message left with [facility] referral to set up appt.

## 2017-08-12 ENCOUNTER — Emergency Department (HOSPITAL_COMMUNITY)
Admission: EM | Admit: 2017-08-12 | Discharge: 2017-08-12 | Disposition: A | Discharge status: Home / Self Care | Payer: BLUE CROSS/BLUE SHIELD | Attending: Physician Assistant | Admitting: Physician Assistant

## 2017-08-12 ENCOUNTER — Encounter (HOSPITAL_COMMUNITY): Payer: Self-pay | Admitting: Emergency Medicine

## 2017-08-12 DIAGNOSIS — R109 Unspecified abdominal pain: Secondary | ICD-10-CM | POA: Insufficient documentation

## 2017-08-12 DIAGNOSIS — R198 Other specified symptoms and signs involving the digestive system and abdomen: Secondary | ICD-10-CM

## 2017-08-12 DIAGNOSIS — Z79899 Other long term (current) drug therapy: Secondary | ICD-10-CM | POA: Insufficient documentation

## 2017-08-12 DIAGNOSIS — R634 Abnormal weight loss: Secondary | ICD-10-CM | POA: Insufficient documentation

## 2017-08-12 HISTORY — DX: Adult hypertrophic pyloric stenosis: K31.1

## 2017-08-12 LAB — CBC
HEMATOCRIT: 44.7 % (ref 39.0–52.0)
Hemoglobin: 15.2 g/dL (ref 13.0–17.0)
MCH: 28.8 pg (ref 26.0–34.0)
MCHC: 34 g/dL (ref 30.0–36.0)
MCV: 84.7 fL (ref 78.0–100.0)
PLATELETS: 147 10*3/uL — AB (ref 150–400)
RBC: 5.28 MIL/uL (ref 4.22–5.81)
RDW: 12.9 % (ref 11.5–15.5)
WBC: 4.9 10*3/uL (ref 4.0–10.5)

## 2017-08-12 LAB — TYPE AND SCREEN
ABO/RH(D): B POS
Antibody Screen: NEGATIVE

## 2017-08-12 LAB — URINALYSIS, ROUTINE W REFLEX MICROSCOPIC
Bilirubin Urine: NEGATIVE
Glucose, UA: NEGATIVE mg/dL
Hgb urine dipstick: NEGATIVE
KETONES UR: NEGATIVE mg/dL
LEUKOCYTES UA: NEGATIVE
NITRITE: NEGATIVE
PH: 7 (ref 5.0–8.0)
PROTEIN: NEGATIVE mg/dL
Specific Gravity, Urine: 1.015 (ref 1.005–1.030)

## 2017-08-12 LAB — COMPREHENSIVE METABOLIC PANEL
ALT: 18 U/L (ref 17–63)
AST: 23 U/L (ref 15–41)
Albumin: 4.9 g/dL (ref 3.5–5.0)
Alkaline Phosphatase: 51 U/L (ref 38–126)
Anion gap: 10 (ref 5–15)
BUN: 11 mg/dL (ref 6–20)
CHLORIDE: 100 mmol/L — AB (ref 101–111)
CO2: 29 mmol/L (ref 22–32)
CREATININE: 0.99 mg/dL (ref 0.61–1.24)
Calcium: 9.8 mg/dL (ref 8.9–10.3)
GFR calc non Af Amer: >60 mL/min (ref >60)
Glucose, Bld: 98 mg/dL (ref 65–99)
Potassium: 3.8 mmol/L (ref 3.5–5.1)
SODIUM: 139 mmol/L (ref 135–145)
Total Bilirubin: 2.4 mg/dL — ABNORMAL HIGH (ref 0.3–1.2)
Total Protein: 7.5 g/dL (ref 6.5–8.1)

## 2017-08-12 LAB — ABO/RH: ABO/RH(D): B POS

## 2017-08-12 LAB — LIPASE, BLOOD: Lipase: 41 U/L (ref 11–51)

## 2017-08-12 MED ORDER — OMEPRAZOLE 20 MG PO CPDR: 20.0000 mg | DELAYED_RELEASE_CAPSULE | Freq: Every day | ORAL | 0 refills | Status: AC

## 2017-08-12 MED ORDER — GI COCKTAIL ~~LOC~~
30.0000 mL | Freq: Once | ORAL | Status: AC
  Administered 2017-08-12: 30 mL via ORAL
  Filled 2017-08-12: qty 30

## 2017-08-12 NOTE — ED Provider Notes (Signed)
[facility] EMERGENCY DEPARTMENT Provider Note   CSN: [id number] Arrival date & time: 08/12/17  1641     History   Chief Complaint Chief Complaint  Patient presents with  . Anorexia  . GI Bleeding    HPI Kyle Zuniga is a 26 y.o. male.  HPI   Pt is a 26 yo here for abdominal concerns.  Patient's had chronic bowel pain for last several years.  To his extensive workup done at [facility].  Including slow gastric emptying study.  Patient had a pyloroplasty.  This helped his symptoms for couple months but has had return of symptoms.  Was seen in October for the similar symptoms again.  Including weight loss and pain with every meal.  Patient was started on some Carafate.  He reports it first helped and then a it has not helped as much.  Patient reports some black stools and increasing pain.  He called his GI who told him to come here to the emergency department to be evaluated.    Going on for 2-4 weeks.    Past Medical History:  Diagnosis Date  . C. difficile diarrhea    3 weeks ago, now about 3 liquid stools  per day  . Depression   . Entamebiasis coli   . GERD (gastroesophageal reflux disease)    takes OTC meds as needed-not regularly  . 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   Past Surgical History:  Procedure Laterality Date  . ESOPHAGOGASTRODUODENOSCOPY (EGD) WITH PROPOFOL N/A 04/26/2015   Procedure: ESOPHAGOGASTRODUODENOSCOPY (EGD) WITH PROPOFOL;  Surgeon: Rachael Feeaniel P Jacobs, MD;  Location: [facility] ENDOSCOPY;  Service: Endoscopy;  Laterality: N/A;  . pyloric stenosis sx   2016   Dr. Kinnie ScalesMedoff  . PYLOROPLASTY         Home Medications    Prior to Admission medications   Medication Sig Start Date End Date Taking? Authorizing Provider  ondansetron (ZOFRAN) 4 MG tablet Take 1 tablet (4 mg total) by mouth every 6 (six) hours. Patient not taking: Reported on 04/25/2015 03/04/15   Nelva NayBeaton, Robert, MD    sertraline (ZOLOFT) 25 MG tablet Take 25 mg by mouth daily.    [provider]    Family History Family History  Problem Relation Age of Onset  . Non-Hodgkin's lymphoma Brother        langerhans histiocytosis  . Pancreatic cancer Paternal Grandmother   . Melanoma Father     Social History Social History   Tobacco Use  . Smoking status: Never Smoker  . Smokeless tobacco: Never Used  Substance Use Topics  . Alcohol use: Yes    Alcohol/week: 4.2 oz    Types: 7 Standard drinks or equivalent per week    Comment: occ  . Drug use: Yes    Types: Marijuana     Allergies   Patient has no known allergies.   Review of Systems Review of Systems  Constitutional: Negative for activity change.  Respiratory: Negative for shortness of breath.   Cardiovascular: Negative for chest pain.  Gastrointestinal: Negative for abdominal pain.     Physical Exam Updated Vital Signs BP 126/65   Pulse (!) 50   Temp 98.7 F (37.1 C) (Oral)   Resp 16   Ht 5' 8.5" (1.74 m)   Wt 53.7 kg (118 lb 4.8 oz)   SpO2 100%   BMI 17.73 kg/m   Physical Exam  Constitutional:  He is oriented to person, place, and time. He appears well-nourished.  Thin male  HENT:  Head: Normocephalic.  Eyes: Conjunctivae and EOM are normal.  Cardiovascular: Normal rate and regular rhythm.  Pulmonary/Chest: Effort normal and breath sounds normal.  Abdominal: Soft. He exhibits no distension. There is no tenderness.  Neurological: He is oriented to person, place, and time.  Skin: Skin is warm and dry. He is not diaphoretic.  Psychiatric: He has a normal mood and affect. His behavior is normal.     ED Treatments / Results  Labs (all labs ordered are listed, but only abnormal results are displayed) Labs Reviewed  COMPREHENSIVE METABOLIC PANEL - Abnormal; Notable for the following components:      Result Value   Chloride 100 (*)    Total Bilirubin 2.4 (*)    All other components within normal limits   CBC - Abnormal; Notable for the following components:   Platelets 147 (*)    All other components within normal limits  LIPASE, BLOOD  URINALYSIS, ROUTINE W REFLEX MICROSCOPIC  POC OCCULT BLOOD, ED  TYPE AND SCREEN  ABO/RH    EKG  EKG Interpretation None       Radiology No results found.  Procedures Procedures (including critical care time)  Medications Ordered in ED Medications  gi cocktail (Maalox,Lidocaine,Donnatal) (not administered)     Initial Impression / Assessment and Plan / ED Course  I have reviewed the triage vital signs and the nursing notes.  Pertinent labs & imaging results that were available during my care of the patient were reviewed by me and considered in my medical decision making (see chart for details).     Pt is a 26 yo here for abdominal concerns.  Patient's had chronic bowel pain for last several years.  To his extensive workup done at [facility].  Including slow gastric emptying study.  Patient had a pyloroplasty.  This helped his symptoms for couple months but has had return of symptoms.  Was seen in October for the similar symptoms again.  Including weight loss and pain with every meal.  Patient was started on some Carafate.  He reports it first helped and then a it has not helped as much.  Patient reports some black stools and increasing pain.  He called his GI who told him to come here to the emergency department to be evaluated.    Going on for 2-4 weeks.     Patient has normal vital signs, normal labs.  Patient has been taking p.o. here in the emergency department.  Give him GI cocktail for symptoms.  I do not anticipate that patient needs inpatient hospitalization given how long the symptoms been going on, normal hemoglobin with normal heart rate.  Long discussion had with family about different etiologies of abdominal pain including functional bowel pain, further workup, and anxiety as a component.  Will recommend they follow-up  with primary care physician as well as their GI physician.  Will trial patient on omeprazole and follow-up with GI as planned on 10 January.  Final Clinical Impressions(s) / ED Diagnoses   Final diagnoses:  None    ED Discharge Orders    None       Allana Shrestha, Cindee Saltourteney Lyn, MD 08/13/17 0006

## 2017-08-12 NOTE — ED Triage Notes (Signed)
Pt to ED from home c/o worsening GI symptoms - abd pain after eating, N/V/D, dark/tarry stools, weight loss. Patient being [facility] for this problem - nothing seems to help. Patient's mother states that they talked to Dr. Rob Buntinganiel Jacobs with [facility] recommended that patient go to ED. Pt denies pain, nausea at this time.

## 2017-08-12 NOTE — Discharge Instructions (Signed)
We talked about your extensive work up done at [facility]. We want you to follow up as planned in 10 days. Pleaes use the omeprazole to help with symptoms. Please return if you are having dark stools and associated lightheadedness.  Please also establish follow up with a PCP. Below are the phone numbers to call.  To find a primary care or specialty doctor please call [phone] or [phone] to access "[place] Find a Doctor Service."  You may also go on the [facility] website at [email or web address]  There are also multiple [facility] practices throughout the [place] that are frequently accepting new patients. You may find a clinic that is close to your home and contact them.  [facility] -  [address] [place] [id number] [phone]  [facility] in [place] (also locations in [place] and [place]) -  [address] [place] [address] [phone]  [facility] -  [address]  [place] [id number] [phone]

## 2017-09-22 ENCOUNTER — Ambulatory Visit: Payer: BLUE CROSS/BLUE SHIELD | Admitting: Physician Assistant

## 2017-10-01 ENCOUNTER — Other Ambulatory Visit (INDEPENDENT_AMBULATORY_CARE_PROVIDER_SITE_OTHER): Payer: BLUE CROSS/BLUE SHIELD

## 2017-10-01 ENCOUNTER — Ambulatory Visit (INDEPENDENT_AMBULATORY_CARE_PROVIDER_SITE_OTHER): Payer: BLUE CROSS/BLUE SHIELD | Admitting: Physician Assistant

## 2017-10-01 ENCOUNTER — Encounter: Payer: Self-pay | Admitting: Physician Assistant

## 2017-10-01 VITALS — BP 110/70 | HR 60 | Ht 68.75 in | Wt 120.0 lb

## 2017-10-01 DIAGNOSIS — R1013 Epigastric pain: Secondary | ICD-10-CM | POA: Diagnosis not present

## 2017-10-01 DIAGNOSIS — R197 Diarrhea, unspecified: Secondary | ICD-10-CM | POA: Diagnosis not present

## 2017-10-01 DIAGNOSIS — R634 Abnormal weight loss: Secondary | ICD-10-CM | POA: Diagnosis not present

## 2017-10-01 DIAGNOSIS — Z9889 Other specified postprocedural states: Secondary | ICD-10-CM

## 2017-10-01 LAB — COMPREHENSIVE METABOLIC PANEL
ALK PHOS: 58 U/L (ref 39–117)
ALT: 14 U/L (ref 0–53)
AST: 15 U/L (ref 0–37)
Albumin: 4.7 g/dL (ref 3.5–5.2)
BILIRUBIN TOTAL: 2 mg/dL — AB (ref 0.2–1.2)
BUN: 10 mg/dL (ref 6–23)
CALCIUM: 9.5 mg/dL (ref 8.4–10.5)
CO2: 29 meq/L (ref 19–32)
Chloride: 103 mEq/L (ref 96–112)
Creatinine, Ser: 0.81 mg/dL (ref 0.40–1.50)
GFR: 124.12 mL/min (ref >60.00)
Glucose, Bld: 87 mg/dL (ref 70–99)
POTASSIUM: 3.9 meq/L (ref 3.5–5.1)
Sodium: 139 mEq/L (ref 135–145)
Total Protein: 7 g/dL (ref 6.0–8.3)

## 2017-10-01 LAB — CBC WITH DIFFERENTIAL/PLATELET
Basophils Absolute: 0 10*3/uL (ref 0.0–0.1)
Basophils Relative: 1 % (ref 0.0–3.0)
Eosinophils Absolute: 0.1 10*3/uL (ref 0.0–0.7)
Eosinophils Relative: 2.6 % (ref 0.0–5.0)
HEMATOCRIT: 42.5 % (ref 39.0–52.0)
Hemoglobin: 14.6 g/dL (ref 13.0–17.0)
LYMPHS PCT: 34.7 % (ref 12.0–46.0)
Lymphs Abs: 1.5 10*3/uL (ref 0.7–4.0)
MCHC: 34.3 g/dL (ref 30.0–36.0)
MCV: 84.3 fl (ref 78.0–100.0)
MONOS PCT: 8.6 % (ref 3.0–12.0)
Monocytes Absolute: 0.4 10*3/uL (ref 0.1–1.0)
NEUTROS ABS: 2.3 10*3/uL (ref 1.4–7.7)
Neutrophils Relative %: 53.1 % (ref 43.0–77.0)
Platelets: 164 10*3/uL (ref 150.0–400.0)
RBC: 5.04 Mil/uL (ref 4.22–5.81)
RDW: 13.7 % (ref 11.5–15.5)
WBC: 4.3 10*3/uL (ref 4.0–10.5)

## 2017-10-01 LAB — SEDIMENTATION RATE: Sed Rate: 1 mm/hr (ref 0–15)

## 2017-10-01 LAB — HIGH SENSITIVITY CRP: CRP, High Sensitivity: 0.24 mg/L (ref 0.000–5.000)

## 2017-10-01 MED ORDER — HYOSCYAMINE SULFATE SL 0.125 MG SL SUBL: SUBLINGUAL_TABLET | SUBLINGUAL | 2 refills | Status: AC

## 2017-10-01 NOTE — Patient Instructions (Addendum)
Please go to the basement level to have your labs drawn and urine test.  We sent a prescription to your pharmacy for Levsin SL.  You may stop Prilosec.    You have been scheduled for a CT scan of the abdomen and pelvis at [facility] ([address]---this is in the same building as Press photographer).   You are scheduled on Friday 10-09-2017 at 4:00 am. You should arrive 15 minutes prior to your appointment time for registration. Please follow the written instructions below on the day of your exam:  WARNING: IF YOU ARE ALLERGIC TO IODINE/X-RAY DYE, PLEASE NOTIFY RADIOLOGY IMMEDIATELY AT [phone]! YOU WILL BE GIVEN A 13 HOUR PREMEDICATION PREP.  1) Do not eat  anything after 12:00 noon (4 hours prior to your test)  You may take any medications as prescribed with a small amount of water except for the following: Metformin, Glucophage, Glucovance, Avandamet, Riomet, Fortamet, Actoplus Met, Janumet, Glumetza or Metaglip. The above medications must be held the day of the exam AND 48 hours after the exam.  The purpose of you drinking the oral contrast is to aid in the visualization of your intestinal tract. The contrast solution may cause some diarrhea. Before your exam is started, you will be given a small amount of fluid to drink. Depending on your individual set of symptoms, you may also receive an intravenous injection of x-ray contrast/dye. Plan on being at [facility] for 30 minutes or long, depending on the type of exam you are having performed.  If you have any questions regarding your exam or if you need to reschedule, you may call the CT department at [phone] between the hours of 8:00 am and 5:00 pm, Monday-Friday.  ________________________________________________________________________

## 2017-10-01 NOTE — Progress Notes (Signed)
CT ang

## 2017-10-02 ENCOUNTER — Encounter: Payer: Self-pay | Admitting: Physician Assistant

## 2017-10-02 NOTE — Progress Notes (Signed)
Subjective:    Patient ID: Kyle Zuniga, male    DOB: 22-Jun-1991, 27 y.o.   MRN: [id number]  HPI [name] is a pleasant 27 year old white male, known previously to Dr. Christella Hartigan who was last seen in our office in 2016. At that time he was seen with weight loss, history of pyloric stenosis by prior EGD, chronic diarrhea early satiety and intermittent vomiting. He also has a brother who was diagnosed with a small bowel lymphoma. Patient has had somewhat complicated history since that time. He underwent EGD with Dr. Christella Hartigan in September 2016 with finding of a stenotic pylorus with 2-3 mm opening. The scope was pushed through and pylorus appeared more normal after that but it was elected to balloon dilate him to 20 mm. His symptoms persisted and he was referred to Dr. Erasmo Leventhal seen by Dr. Judeth Cornfield in October 2016. Gastric emptying study was pursued, electrogastrogram, and he was tested for celiac disease with TTG and IgA Gastric emptying scan was abnormal and electrogastrogram suggested pyloric outlet obstruction and he was referred to surgery and underwent pyloroplasty with Dr. Barnetta Chapel December 2016. Apparently his symptoms improved initially after surgery, but the patient says symptoms returned and he is had ongoing problems again over the past 2 years. He has been seen by [facility] in [place] that was not making any progress with them  recently, and apparently it had been suggested that he get established with pain management for management of his symptoms which neither he or his parents wanted to do so the meat appointment here for another opinion. He relates a total of about a 30 pound weight loss since age of 80. He says his current symptoms are the same symptoms he had even prior to undergoing the pyloroplasty he complains of a dull burning type pain in his epigastrium which occurs every time he eats any significant amount of food, usually starts within 10-15 minutes of  eating and will last for up to an hour. If he consumes a regular meal he will get the same symptoms but also generally develops diaphoresis after eating has some pain up into his chest and his left shoulder and has to lie down he feels so bad. This usually this will pass in about 30 minutes. He generally feels best if he hasn't eaten anything at all. He has been subsisting on very small amounts of food. He says he has had chronically loose stools for years which haven't changed, no melena or hematochezia. He gets nauseated after most meals but doesn't vomit. He does not remember whether Reglan helped his symptoms in the past. He currently has Zofran to use on an as needed basis and has been taking omeprazole 20 mg by mouth daily. Most recent GI evaluation was in August 2018 with upper endoscopy showing a patent pylorus, CT of the abdomen and pelvis in August 2018 showed some hepatic steatosis and a small amount of fluid in the pelvis but was otherwise negative and CCK HIDA scan was normal with an EF of 68%  Review of Systems Pertinent positive and negative review of systems were noted in the above HPI section.  All other review of systems was otherwise negative.  Outpatient Encounter Medications as of 10/01/2017  Medication Sig  . omeprazole (PRILOSEC) 20 MG capsule Take 1 capsule (20 mg total) by mouth daily.  Marland Kitchen Hyoscyamine Sulfate SL (LEVSIN/SL) 0.125 MG SUBL Place 1 tab on the tongue to dissolve 3 times daily before meals.  . ondansetron (  ZOFRAN) 4 MG tablet Take 1 tablet (4 mg total) by mouth every 6 (six) hours. (Patient not taking: Reported on 04/25/2015)  . sertraline (ZOLOFT) 25 MG tablet Take 25 mg by mouth daily.   No facility-administered encounter medications on file as of 10/01/2017.    No Known Allergies There are no active problems to display for this patient.  Social History   Socioeconomic History  . Marital status: Married    Spouse name: Not on file  . Number of children: 0  .  Years of education: Not on file  . Highest education level: Not on file  Social Needs  . Financial resource strain: Not on file  . Food insecurity - worry: Not on file  . Food insecurity - inability: Not on file  . Transportation needs - medical: Not on file  . Transportation needs - non-medical: Not on file  Occupational History  . Occupation: Consulting civil engineer  Tobacco Use  . Smoking status: Never Smoker  . Smokeless tobacco: Never Used  Substance and Sexual Activity  . Alcohol use: Yes    Alcohol/week: 4.2 oz    Types: 7 Standard drinks or equivalent per week    Comment: occ  . Drug use: Yes    Types: Marijuana  . Sexual activity: Not on file  Other Topics Concern  . Not on file  Social History Narrative  . Not on file    Mr. Levan family history includes Melanoma in his father; Non-Hodgkin's lymphoma in his brother; Pancreatic cancer in his paternal grandmother.      Objective:    Vitals:   10/01/17 0835  BP: 110/70  Pulse: 60    Physical Exam ; well-developed young white male in no acute distress, very pleasant accompanied by both his parents. Blood pressure 110/70 pulse 60, height 5 foot 8, weight 120, BMI 17.8. HEENT; nontraumatic normocephalic EOMI PERRLA sclera anicteric, Cardiovascular; regular rate and rhythm with S1-S2 no murmur or gallop, Pulmonary; clear bilaterally, Abdomen ;soft, laparoscopic port incisional scars, bowel sounds are present, no palpable mass or hepatosplenomegaly, nontender, Rectal ;exam not done, Extremities; no clubbing cyanosis or edema skin warm and dry, Neuropsych; mood and affect appropriate       Assessment & Plan:   #21 27 year old white male with several year history of ongoing GI symptoms with postprandial abdominal pain, weight loss, and loose stool. Patient diagnosed with pyloric stenosis in 2016, underwent balloon dilation, had recurrence of symptoms and then ultimately had abnormal gastric emptying scan and electrogastrogram  suggesting pyloric obstruction and had pyloroplasty done surgically in 2016. Symptoms recurred shortly thereafter and have been ongoing. He relates daily problems with a dull burning pain in the epigastrium which occurs after any by mouth intake and is worse with larger meals. With any larger amounts of by mouth intake he will also have associated diaphoresis and at times will get pain in his chest radiating up into the left shoulder.  Feels poorly in general and has to lie down postprandially  Weight loss total of about 30 pounds since age 47  Etiology of current symptoms is not clear. Rule out idiopathic gastroparesis though very unusual to have significant abdominal pain with this. , I'm concerned with symptoms of postprandial diaphoresis associated with pain and weakness. We will rule out underlying vascular pathology, mesenteric insufficiency, SMA syndrome, also may need to be worked up for underlying neuroendocrine gut pathology  Plan; CBC, CMET, CRP, sedimentation rate, ANA, serum gastrin, 24-hour urine for 5-HIAA Stop Prilosec, as  has had no benefit Will try Levsin sublingual before meals and when necessary, as a trial. Schedule for CT angio of the abdomen. We'll also discuss further with Dr. Christella HartiganJacobs.-He may need repeat gastric emptying studies.  Tylynn Braniff S Avani Sensabaugh PA-C 10/02/2017   Cc: Johny BlamerHarris, William, MD

## 2017-10-05 ENCOUNTER — Other Ambulatory Visit: Payer: BLUE CROSS/BLUE SHIELD

## 2017-10-05 DIAGNOSIS — R634 Abnormal weight loss: Secondary | ICD-10-CM

## 2017-10-05 DIAGNOSIS — R1013 Epigastric pain: Secondary | ICD-10-CM

## 2017-10-05 DIAGNOSIS — Z9889 Other specified postprocedural states: Secondary | ICD-10-CM

## 2017-10-05 NOTE — Progress Notes (Signed)
I agree with the above note, plan 

## 2017-10-06 LAB — ANA: ANA: NEGATIVE

## 2017-10-06 LAB — GASTRIN

## 2017-10-08 LAB — 5 HIAA, QUANTITATIVE, URINE, 24 HOUR
5-HIAA, Ur: 2.6 mg/L
5-HIAA,Quant.,24 Hr Urine: 2 mg/24 hr (ref 0.0–14.9)

## 2017-10-09 ENCOUNTER — Ambulatory Visit (INDEPENDENT_AMBULATORY_CARE_PROVIDER_SITE_OTHER)
Admission: RE | Admit: 2017-10-09 | Discharge: 2017-10-09 | Disposition: A | Discharge status: Home / Self Care | Payer: BLUE CROSS/BLUE SHIELD | Source: Ambulatory Visit | Attending: Physician Assistant | Admitting: Physician Assistant

## 2017-10-09 DIAGNOSIS — R634 Abnormal weight loss: Secondary | ICD-10-CM | POA: Diagnosis not present

## 2017-10-09 DIAGNOSIS — Z9889 Other specified postprocedural states: Secondary | ICD-10-CM

## 2017-10-09 DIAGNOSIS — R1013 Epigastric pain: Secondary | ICD-10-CM | POA: Diagnosis not present

## 2017-10-16 ENCOUNTER — Other Ambulatory Visit: Payer: Self-pay

## 2017-10-20 ENCOUNTER — Telehealth: Payer: Self-pay | Admitting: Physician Assistant

## 2017-10-20 NOTE — Telephone Encounter (Signed)
Patient mother states pt was suppose to be [facility] and has not heard from anyone about this. Patient mother wanting an update.

## 2017-10-20 NOTE — Telephone Encounter (Signed)
Message left for scheduling at Dr Lorelee NewMcNatt's office.

## 2017-10-22 NOTE — Telephone Encounter (Signed)
Spoke with scheduling at Dr Lorelee NewMcNatt's office.  New fax number obtained [phone]. Records re-faxed.

## 2017-10-26 ENCOUNTER — Telehealth: Payer: Self-pay | Admitting: Physician Assistant

## 2017-10-26 NOTE — Telephone Encounter (Signed)
Called Dr Lorelee NewMcNatt's office again. "Cordelia PenSherry" tell me the faxing system was down for a week hospital wide. She does not see anything on the patient in the work que. Faxed records a 3rd time.

## 2017-10-26 NOTE — Telephone Encounter (Signed)
Ms. Kyle Zuniga notified of these issues. I apologized for the wait.

## 2017-10-28 ENCOUNTER — Other Ambulatory Visit: Payer: Self-pay

## 2017-10-28 NOTE — Telephone Encounter (Signed)
Appointment is April 11at 10:15 am. Mother of the patient is advised.

## 2017-10-28 NOTE — Telephone Encounter (Signed)
Patient father now calling and states that Dr.McNatt's office still does not have referral.

## 2017-11-02 ENCOUNTER — Encounter: Payer: Self-pay | Admitting: Physician Assistant

## 2017-11-02 ENCOUNTER — Emergency Department (HOSPITAL_COMMUNITY)
Admission: EM | Admit: 2017-11-02 | Discharge: 2017-11-02 | Disposition: A | Discharge status: Home / Self Care | Payer: BLUE CROSS/BLUE SHIELD | Attending: Emergency Medicine | Admitting: Emergency Medicine

## 2017-11-02 ENCOUNTER — Encounter (HOSPITAL_COMMUNITY): Payer: Self-pay | Admitting: *Deleted

## 2017-11-02 ENCOUNTER — Other Ambulatory Visit: Payer: Self-pay

## 2017-11-02 DIAGNOSIS — E860 Dehydration: Secondary | ICD-10-CM | POA: Insufficient documentation

## 2017-11-02 DIAGNOSIS — F1210 Cannabis abuse, uncomplicated: Secondary | ICD-10-CM | POA: Diagnosis not present

## 2017-11-02 DIAGNOSIS — R51 Headache: Secondary | ICD-10-CM | POA: Diagnosis not present

## 2017-11-02 DIAGNOSIS — Z79899 Other long term (current) drug therapy: Secondary | ICD-10-CM | POA: Insufficient documentation

## 2017-11-02 DIAGNOSIS — R112 Nausea with vomiting, unspecified: Secondary | ICD-10-CM | POA: Insufficient documentation

## 2017-11-02 DIAGNOSIS — R1084 Generalized abdominal pain: Secondary | ICD-10-CM | POA: Diagnosis not present

## 2017-11-02 LAB — URINALYSIS, ROUTINE W REFLEX MICROSCOPIC
Bacteria, UA: NONE SEEN
Bilirubin Urine: NEGATIVE
Glucose, UA: NEGATIVE mg/dL
Hgb urine dipstick: NEGATIVE
Ketones, ur: 80 mg/dL — AB
Leukocytes, UA: NEGATIVE
NITRITE: NEGATIVE
PH: 5 (ref 5.0–8.0)
Protein, ur: 30 mg/dL — AB
SPECIFIC GRAVITY, URINE: 1.027 (ref 1.005–1.030)
Squamous Epithelial / LPF: NONE SEEN

## 2017-11-02 LAB — COMPREHENSIVE METABOLIC PANEL
ALK PHOS: 67 U/L (ref 38–126)
ALT: 36 U/L (ref 17–63)
ANION GAP: 18 — AB (ref 5–15)
AST: 45 U/L — ABNORMAL HIGH (ref 15–41)
Albumin: 5.2 g/dL — ABNORMAL HIGH (ref 3.5–5.0)
BILIRUBIN TOTAL: 3.5 mg/dL — AB (ref 0.3–1.2)
BUN: 17 mg/dL (ref 6–20)
CALCIUM: 10.3 mg/dL (ref 8.9–10.3)
CO2: 21 mmol/L — AB (ref 22–32)
CREATININE: 1.05 mg/dL (ref 0.61–1.24)
Chloride: 100 mmol/L — ABNORMAL LOW (ref 101–111)
Glucose, Bld: 111 mg/dL — ABNORMAL HIGH (ref 65–99)
Potassium: 4.6 mmol/L (ref 3.5–5.1)
SODIUM: 139 mmol/L (ref 135–145)
TOTAL PROTEIN: 8.1 g/dL (ref 6.5–8.1)

## 2017-11-02 LAB — CBC
HCT: 46.8 % (ref 39.0–52.0)
HEMOGLOBIN: 16.3 g/dL (ref 13.0–17.0)
MCH: 29.6 pg (ref 26.0–34.0)
MCHC: 34.8 g/dL (ref 30.0–36.0)
MCV: 84.9 fL (ref 78.0–100.0)
PLATELETS: 218 10*3/uL (ref 150–400)
RBC: 5.51 MIL/uL (ref 4.22–5.81)
RDW: 13.3 % (ref 11.5–15.5)
WBC: 10.5 10*3/uL (ref 4.0–10.5)

## 2017-11-02 LAB — LIPASE, BLOOD: Lipase: 25 U/L (ref 11–51)

## 2017-11-02 MED ORDER — SODIUM CHLORIDE 0.9 % IV BOLUS (SEPSIS)
1000.0000 mL | Freq: Once | INTRAVENOUS | Status: AC
  Administered 2017-11-02: 1000 mL via INTRAVENOUS

## 2017-11-02 MED ORDER — ONDANSETRON 8 MG PO TBDP: 8.0000 mg | ORAL_TABLET | Freq: Three times a day (TID) | ORAL | 0 refills | Status: AC | PRN

## 2017-11-02 MED ORDER — PROMETHAZINE HCL 25 MG RE SUPP: 25.0000 mg | Freq: Four times a day (QID) | RECTAL | 0 refills | Status: AC | PRN

## 2017-11-02 MED ORDER — KETOROLAC TROMETHAMINE 30 MG/ML IJ SOLN
30.0000 mg | Freq: Once | INTRAMUSCULAR | Status: AC
  Administered 2017-11-02: 30 mg via INTRAVENOUS
  Filled 2017-11-02: qty 1

## 2017-11-02 MED ORDER — ONDANSETRON 4 MG PO TBDP
8.0000 mg | ORAL_TABLET | Freq: Once | ORAL | Status: AC
  Administered 2017-11-02: 8 mg via ORAL
  Filled 2017-11-02: qty 2

## 2017-11-02 NOTE — ED Provider Notes (Signed)
[facility] EMERGENCY DEPARTMENT Provider Note   CSN: [id number] Arrival date & time: 11/02/17  0865     History   Chief Complaint Chief Complaint  Patient presents with  . Abdominal Pain    HPI Kyle Zuniga is a 27 y.o. male.  HPI Patient is a 27 year old male presents the emergency department nausea and vomiting which really began last night.  He has a long-standing history of similar symptoms.  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  He is being followed closely by GI at [facility] as well as [facility] here locally.  He is scheduled to see Dr. Barnetta Chapel, general surgery at [facility] in the early part of April 2019.  He presents back with recurrent symptoms of nausea vomiting and generalized crampy abdominal pain.  He was given Zofran prior to my evaluation and feels much better at this time.  He states these are typical for his condition.  He denies blood in his vomit.  Reports he always has watery stool but reports no change in his bowel habits.  Denies abdominal pain at this time.  Reports mild headache at this time. Past Medical History:  Diagnosis Date  . C. difficile diarrhea    3 weeks ago, now about 3 liquid stools  per day  . Depression   . Entamebiasis coli   . GERD (gastroesophageal reflux disease)    takes OTC meds as needed-not regularly  . 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   Past Surgical History:  Procedure Laterality Date  . ESOPHAGOGASTRODUODENOSCOPY (EGD) WITH PROPOFOL N/A 04/26/2015   Procedure: ESOPHAGOGASTRODUODENOSCOPY (EGD) WITH PROPOFOL;  Surgeon: Rachael Fee, MD;  Location: [facility] ENDOSCOPY;  Service: Endoscopy;  Laterality: N/A;  . pyloric stenosis sx   2016   Dr. Kinnie Scales  . PYLOROPLASTY         Home Medications    Prior to Admission medications   Medication  Sig Start Date End Date Taking? Authorizing Provider  Hyoscyamine Sulfate SL (LEVSIN/SL) 0.125 MG SUBL Place 1 tab on the tongue to dissolve 3 times daily before meals. 10/01/17  Yes Esterwood, Amy S, PA-C  omeprazole (PRILOSEC) 20 MG capsule Take 1 capsule (20 mg total) by mouth daily. Patient not taking: Reported on 11/02/2017 08/12/17   Mackuen, Courteney Lyn, MD  ondansetron (ZOFRAN) 4 MG tablet Take 1 tablet (4 mg total) by mouth every 6 (six) hours. Patient not taking: Reported on 04/25/2015 03/04/15   [name], MD    Family History Family History  Problem Relation Age of Onset  . Non-Hodgkin's lymphoma Brother        langerhans histiocytosis  . Pancreatic cancer Paternal Grandmother   . Melanoma Father     Social History Social History   Tobacco Use  . Smoking status: Never Smoker  . Smokeless tobacco: Never Used  Substance Use Topics  . Alcohol use: Yes    Alcohol/week: 4.2 oz    Types: 7 Standard drinks or equivalent per week    Comment: occ  . Drug use: Yes    Types: Marijuana     Allergies   Patient has no known allergies.   Review of Systems Review of Systems  All other systems reviewed and are negative.    Physical Exam Updated Vital Signs BP 125/77  Pulse 79   Resp 16   Ht  (1.753 m)   Wt 54.4 kg (120 lb)   SpO2 99%   BMI 17.72 kg/m   Physical Exam  Constitutional: He is oriented to person, place, and time. He appears well-developed and well-nourished.  HENT:  Head: Normocephalic and atraumatic.  Eyes: EOM are normal.  Neck: Normal range of motion.  Cardiovascular: Normal rate, regular rhythm, normal heart sounds and intact distal pulses.  Pulmonary/Chest: Effort normal and breath sounds normal. No respiratory distress.  Abdominal: Soft. He exhibits no distension. There is no tenderness.  Musculoskeletal: Normal range of motion.  Neurological: He is alert and oriented to person, place, and time.  Skin: Skin is warm and dry.    Psychiatric: He has a normal mood and affect. Judgment normal.  Nursing note and vitals reviewed.    ED Treatments / Results  Labs (all labs ordered are listed, but only abnormal results are displayed) Labs Reviewed  COMPREHENSIVE METABOLIC PANEL - Abnormal; Notable for the following components:      Result Value   Chloride 100 (*)    CO2 21 (*)    Glucose, Bld 111 (*)    Albumin 5.2 (*)    AST 45 (*)    Total Bilirubin 3.5 (*)    Anion gap 18 (*)    All other components within normal limits  URINALYSIS, ROUTINE W REFLEX MICROSCOPIC - Abnormal; Notable for the following components:   Ketones, ur 80 (*)    Protein, ur 30 (*)    All other components within normal limits  LIPASE, BLOOD  CBC    EKG  EKG Interpretation None       Radiology No results found.  Procedures Procedures (including critical care time)  Medications Ordered in ED Medications  ondansetron (ZOFRAN-ODT) disintegrating tablet 8 mg (8 mg Oral Given 11/02/17 0722)  sodium chloride 0.9 % bolus 1,000 mL (0 mLs Intravenous Stopped 11/02/17 0939)  sodium chloride 0.9 % bolus 1,000 mL (0 mLs Intravenous Stopped 11/02/17 1036)  ketorolac (TORADOL) 30 MG/ML injection 30 mg (30 mg Intravenous Given 11/02/17 0938)     Initial Impression / Assessment and Plan / ED Course  I have reviewed the triage vital signs and the nursing notes.  Pertinent labs & imaging results that were available during my care of the patient were reviewed by me and considered in my medical decision making (see chart for details).     Improvement in the symptoms in the emergency department.  No vomiting.  Tolerating oral fluids.  IV hydrated.  Complex past medical history and complex GI history.  Patient has ongoing follow-up scheduled with his subspecialty teams as an outpatient.  No indication for additional testing acutely at this time.  Close primary care and GI follow-up.  Awaiting general surgery consultation at [facility].   Patient and family understand to return to the emergency department for new or worsening symptoms  Final Clinical Impressions(s) / ED Diagnoses   Final diagnoses:  Nausea and vomiting, intractability of vomiting not specified, unspecified vomiting type  Dehydration    ED Discharge Orders    None       Azalia Bilis, MD 11/02/17 1545

## 2017-11-02 NOTE — ED Triage Notes (Signed)
The pt is c/o abd pain nausea vomiting with chills since 0300am today.  He has some type syndrome that causes the nausea and vomiting

## 2017-11-04 ENCOUNTER — Other Ambulatory Visit: Payer: Self-pay

## 2017-11-04 DIAGNOSIS — E46 Unspecified protein-calorie malnutrition: Secondary | ICD-10-CM

## 2017-11-04 DIAGNOSIS — R634 Abnormal weight loss: Secondary | ICD-10-CM

## 2017-11-24 ENCOUNTER — Telehealth: Payer: Self-pay | Admitting: Gastroenterology

## 2017-11-24 NOTE — Telephone Encounter (Signed)
Dr Christella HartiganJacobs did you talk to Amy regarding this pt?

## 2017-11-26 NOTE — Telephone Encounter (Signed)
We are unable to explain his symptoms despite numerous GI tests (see AE recent note) also surgical evaluation at [facility]. He sees me and also [facility] at [facility].    He needs to continue to follow at tertiary care center for GI.  If he needs/wants a referral to a new tertiary care center I am happy to help with that.

## 2017-11-26 NOTE — Telephone Encounter (Signed)
The pt's mother was advised to let me know if she would like to be referred to another tertiary center after discussing with the pt and I can send that referral.  She will call back with the name of a physician they choose.

## 2017-11-27 NOTE — Telephone Encounter (Signed)
The pt's mother called and states she would like a referral to Dr Leanne LovelyJane [facility] [phone].

## 2017-12-01 ENCOUNTER — Encounter: Payer: Self-pay | Admitting: Skilled Nursing Facility1

## 2017-12-01 ENCOUNTER — Encounter: Payer: BLUE CROSS/BLUE SHIELD | Attending: Family Medicine | Admitting: Skilled Nursing Facility1

## 2017-12-01 DIAGNOSIS — E46 Unspecified protein-calorie malnutrition: Secondary | ICD-10-CM

## 2017-12-01 DIAGNOSIS — Z713 Dietary counseling and surveillance: Secondary | ICD-10-CM | POA: Insufficient documentation

## 2017-12-01 DIAGNOSIS — R634 Abnormal weight loss: Secondary | ICD-10-CM | POA: Diagnosis not present

## 2017-12-01 NOTE — Patient Instructions (Addendum)
[email or web address] [facility] [phone])  experiment with chicken : either the fried is an issue for the chicken itself   Try the hamburger without the bun  Try Procare health multivitamin   Take at least 2 calcium supplements   Aim for 64 fluid ounces   Bring your cooler for work: lunch meat or cold rotisserie chicken with chips   Aim for 3 meals every day: ensure in the morning and the ice cream shake in the evening   Try some spinach in your smoothie   Try the boost juice and other drinks like that to replace some of the cokes

## 2017-12-01 NOTE — Progress Notes (Signed)
  Assessment:  Primary concerns today: weight loss.  Pt arrived with his seemingly supportive father.Pt states When he eats his stomach hurts really bad, lost about 40 pounds in last year, thought pyloric stenosis had surgery for that but still having the pains. Pts father states he [facility]. Pt states starting about 4 years ago has been losing weight and having stomach pains. Pt states the last year his weight has been stable at 115-120 pounds. Pt states his dream weight is to be 150 pounds. Pt states he was waking up in the middle of the night with vomiting. Pt states Water does not cause pain. Pt states he has liquidy stools. Pt states he Feels like his food moves right through him. 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 Pt states he Likes [employer] fila and [employer] but chic fila hurts more (always having fried chicken there and a hamburger at [employer]). Pt states he has tested for celiac and it came back negative. Pt states all foods hurt it is just a matter of finding some that causes tolerable pain although he is thinking steak and rice may not cause any pain.  Pt states he is derpressed when not with his friends but being with his friends makes him feel better. Pt states he was on Antidepressants back in the day which did not help. Labs: Chloride 100, CO2: 21, Glucose 111, Albumin 5.2, AST 45, total bilirubin: 3.5  MEDICATIONS: See List    DIETARY INTAKE:  Usual eating pattern includes 2 meals and 1 snacks per day.  Everyday foods include fast food.  Avoided foods include none stated.    24-hr recall:  B ( AM): skipped Snk ( AM):  L ( PM): fast food (has to be quick): burger and fries (double cheese burger and fries  Snk ( PM):  D ( PM): hibachi: steak and rice  Snk ( PM):  Beverages: 5 coke, 32 ounces water  Usual physical activity: ADl's  Estimated energy needs: 2000 calories 225 g carbohydrates 150 g protein 56 g  fat  Progress Towards Goal(s):  In progress.   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 As evidenced by pain in stomach when eating, unintentional weight loss of 40 pounds in the last year, and diminished food capacity.     Intervention:  Nutrition counseling. Dietitian assisted pt in identifying tolerated foods and educated pt in needed nutrition. Goals: [email or web address] [email or web address] [phone](1-(715)074-2140) experiment with chicken : either the fried is an issue for the chicken itself  Try the hamburger without the bun Try Procare health multivitamin  Take at least 2 calcium supplements  Aim for 64 fluid ounces  Bring your cooler for work: lunch meat or cold rotisserie chicken with chips  Aim for 3 meals every day: ensure in the morning and the ice cream shake in the evening  Try some spinach in your smoothie  Try the boost juice and other drinks like that to replace some of the cokes   Teaching Method Utilized:  Visual Auditory Hands on  Barriers to learning/adherence to lifestyle change: Unidentified  GI disturbance  Demonstrated degree of understanding via:  Teach Back   Monitoring/Evaluation:  Dietary intake, and body weight prn.

## 2017-12-08 ENCOUNTER — Telehealth: Payer: Self-pay | Admitting: Gastroenterology

## 2017-12-08 NOTE — Telephone Encounter (Signed)
Pt's father called to inform that [facility] has not contact them yet to make an appt for his son. He requests a cb, do not call his wife [place]

## 2017-12-08 NOTE — Telephone Encounter (Signed)
He pt was advised that [facility] confirmed receipt of referral and will be called in the next few days.  He will call back if not called by Friday

## 2018-11-02 ENCOUNTER — Ambulatory Visit (INDEPENDENT_AMBULATORY_CARE_PROVIDER_SITE_OTHER): Payer: Self-pay | Admitting: Orthopedic Surgery

## 2019-09-09 IMAGING — CT CT CTA ABD/PEL W/CM AND/OR W/O CM
2 of 10 series · 9 of 46 positions shown, 15 images · IV contrast (ISOVUE 370)
Comparison: Prior CT scan of the abdomen and pelvis 04/12/2015

CLINICAL DATA: 26-year-old male with a several year history of post
prandial abdominal pain, and weight loss. Evaluate for median
arcuate ligament compression or other SMA syndrome.

EXAM:
CT ANGIOGRAPHY ABDOMEN AND PELVIS WITH CONTRAST AND WITHOUT CONTRAST
TECHNIQUE: Multidetector CT imaging of the abdomen and pelvis was performed
using the standard protocol during bolus administration of
intravenous contrast. Multiplanar reconstructed images and MIPs were
obtained and reviewed to evaluate the vascular anatomy.
CONTRAST:  80 mL Isovue 370

[Series 7: coronals · coronal · 0.59mm/px · 2 of 105 slices shown, 3 images]
[im 35/105  soft-tissue]
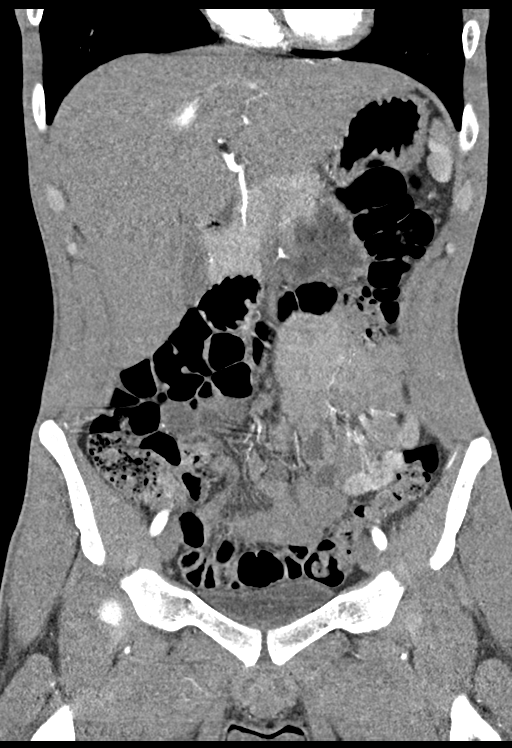
[im 35/105  bone]
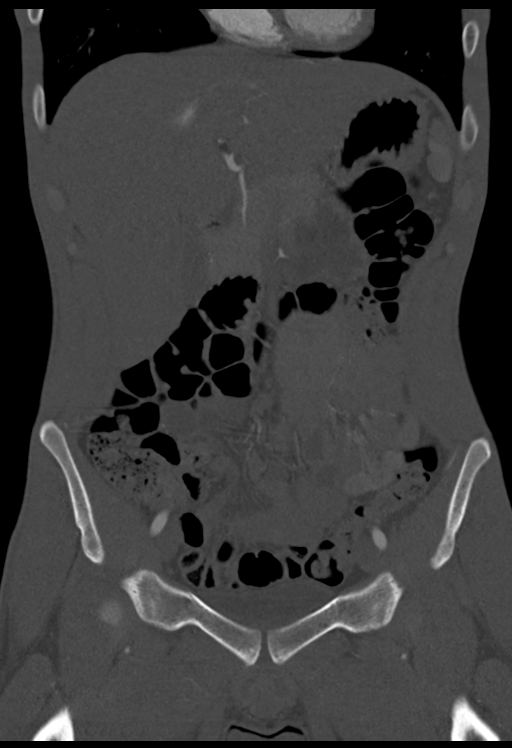
[im 70/105  soft-tissue]
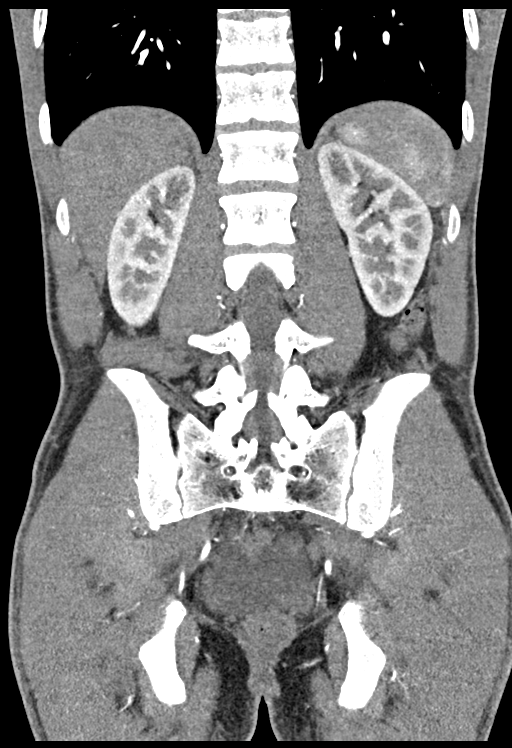

[Series 11: venous 5.0 i31f 2 · axial · portal-venous · 0.62mm/px · z∈[-421,-101]mm · 7 of 86 slices shown, 12 images]
[im 11/86  soft-tissue]
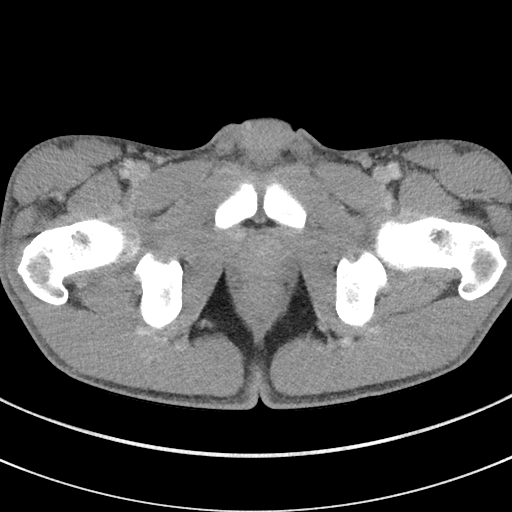
[im 11/86  bone]
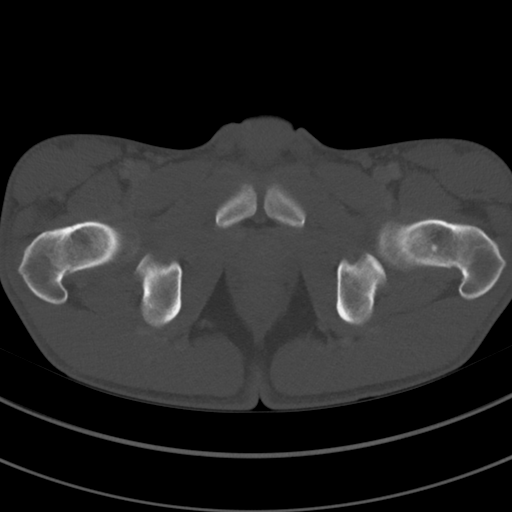
[im 22/86  soft-tissue]
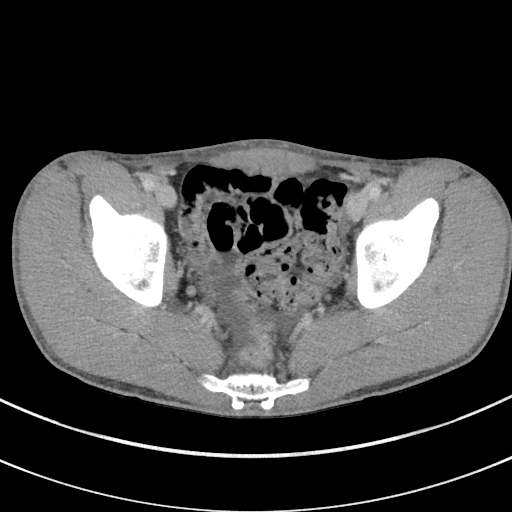
[im 32/86  soft-tissue]
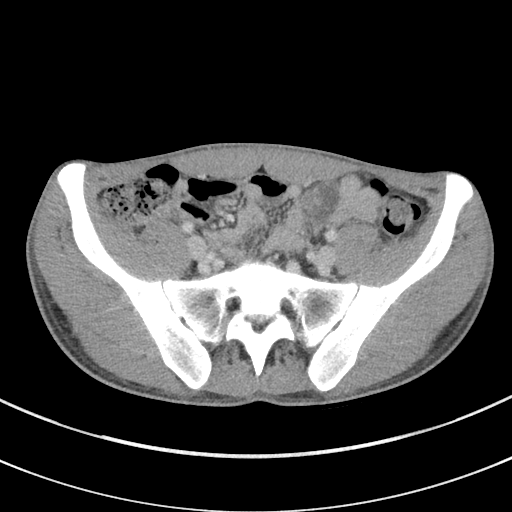
[im 43/86  soft-tissue]
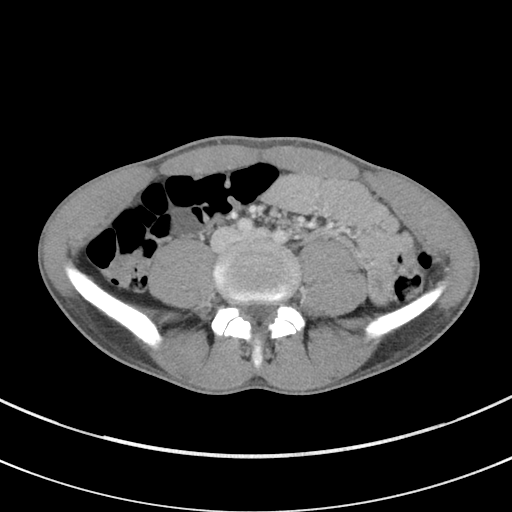
[im 43/86  lung]
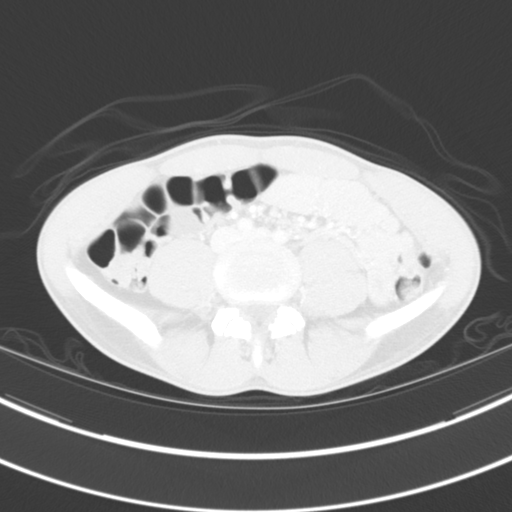
[im 54/86  soft-tissue]
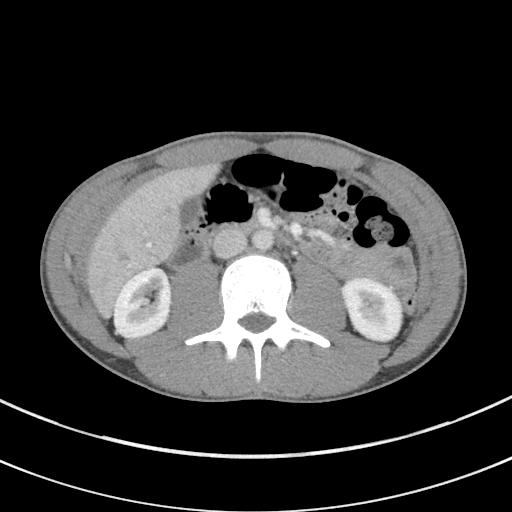
[im 54/86  lung]
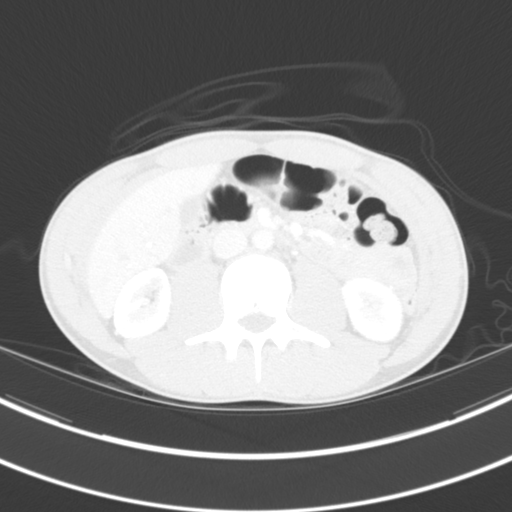
[im 64/86  soft-tissue]
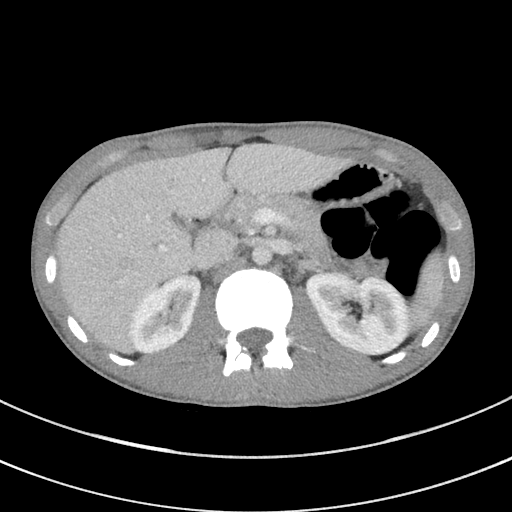
[im 64/86  lung]
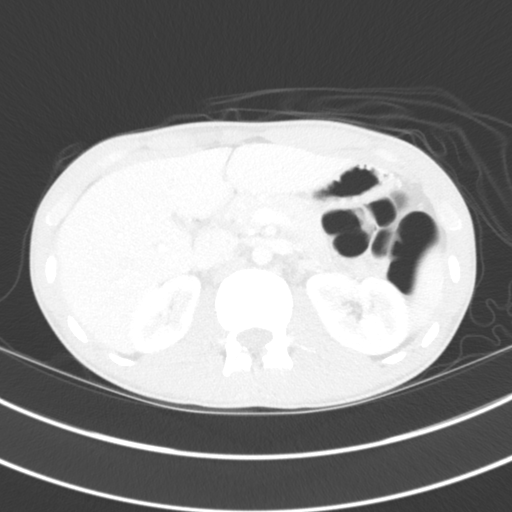
[im 75/86  soft-tissue]
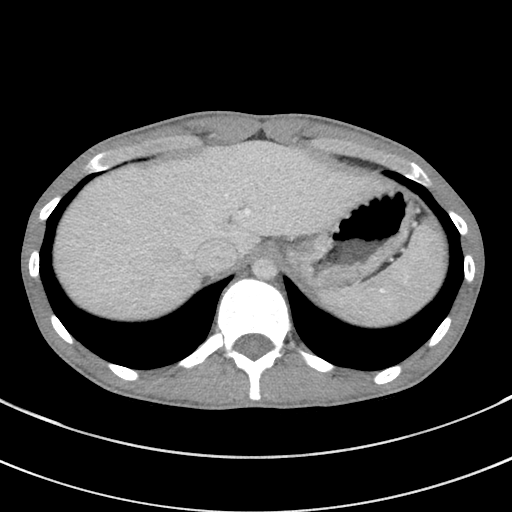
[im 75/86  lung]
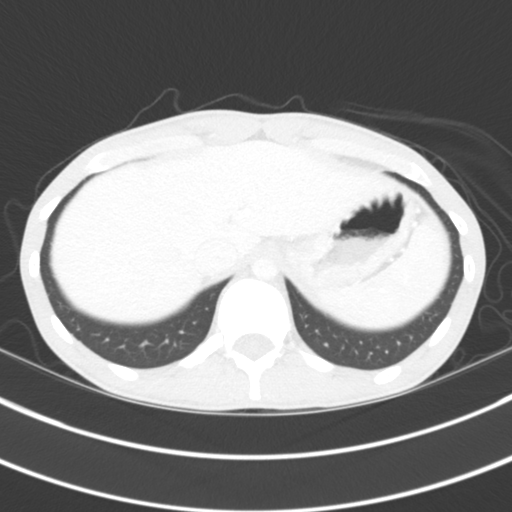

[9 of 46 positions shown; findings below may reference images not displayed]

FINDINGS: VASCULAR

Aorta: Normal caliber aorta without aneurysm, dissection, vasculitis
or significant stenosis.

Celiac: Patent without evidence of aneurysm, dissection, vasculitis
or significant stenosis.

SMA: Patent without evidence of aneurysm, dissection, vasculitis or
significant stenosis.

Renals: Both renal arteries are patent without evidence of aneurysm,
dissection, vasculitis, fibromuscular dysplasia or significant
stenosis.

IMA: Patent without evidence of aneurysm, dissection, vasculitis or
significant stenosis.

Inflow: Patent without evidence of aneurysm, dissection, vasculitis
or significant stenosis.

Proximal Outflow: Bilateral common femoral and visualized portions
of the superficial and profunda femoral arteries are patent without
evidence of aneurysm, dissection, vasculitis or significant
stenosis.

Veins: Widely patent and unremarkable hepatic, portal, renal and
mesenteric veins. Circumaortic left renal vein noted incidentally.
The IVC and bilateral iliac veins are widely patent.

Review of the MIP images confirms the above findings.

NON-VASCULAR

Lower chest: The lung bases are clear. Visualized cardiac structures
are within normal limits for size. No pericardial effusion.
Unremarkable visualized distal thoracic esophagus.

Hepatobiliary: Normal hepatic contour and morphology. No discrete
hepatic lesions. Normal appearance of the gallbladder. No intra or
extrahepatic biliary ductal dilatation.

Pancreas: Unremarkable. No pancreatic ductal dilatation or
surrounding inflammatory changes.

Spleen: Normal in size without focal abnormality.

Adrenals/Urinary Tract: Adrenal glands are unremarkable. Kidneys are
normal, without renal calculi, focal lesion, or hydronephrosis.
Bladder is unremarkable.

Stomach/Bowel: Relative paucity of intra-abdominal fat. There is a
small amount of space between the superior mesenteric artery and the
aorta. There is very mild dilatation of the third segment of the
duodenum prior to passing between the SMA and aorta. No definite
obstruction, focal bowel wall thickening or abnormal enhancement.
Appendix is not definitively identified, however there is no
evidence of inflammation or abnormal tubular structure in the right
lower quadrant.

Lymphatic: No suspicious lymphadenopathy.

Reproductive: Prostate is unremarkable.

Other: Small volume ascites in the pelvic cul-de-sac is an abnormal
but nonspecific finding.

Musculoskeletal: No acute or significant osseous findings.
IMPRESSION: VASCULAR

1. Normal CT arteriogram of the abdomen and pelvis. No evidence of
acute or chronic vascular abnormality.

NON-VASCULAR

1. Small volume ascites in the pelvic cul-de-sac is an abnormal but
non-specific in a male patient. This suggests an underlying
infectious or inflammatory process involving the small bowel or
colon which is otherwise occult by imaging.
2. Relatively small space between the superior mesenteric artery and
aorta secondary to a paucity of intra-abdominal fat. This could
conceivably result in some compression of the duodenum (SMA
syndrome) as it passes between the SMA and aorta. However, there is
no significant duodenal or gastric dilatation on today's examination
to suggest duodenal obstruction at baseline (empty stomach).
